# Patient Record
Sex: Female | Born: 1975 | Hispanic: No | Marital: Married | Smoking: Never smoker
Health system: Southern US, Community
[De-identification: ages and names within clinical notes are randomized; demographics above are authoritative.]

## PROBLEM LIST (undated history)

## (undated) DIAGNOSIS — Z789 Other specified health status: Secondary | ICD-10-CM

## (undated) HISTORY — PX: NO PAST SURGERIES: SHX2092

---

## 2014-10-18 LAB — OB RESULTS CONSOLE ANTIBODY SCREEN: Antibody Screen: NEGATIVE

## 2014-10-18 LAB — OB RESULTS CONSOLE VARICELLA ZOSTER ANTIBODY, IGG: Varicella: IMMUNE

## 2014-10-18 LAB — OB RESULTS CONSOLE GC/CHLAMYDIA
CHLAMYDIA, DNA PROBE: NEGATIVE
Gonorrhea: NEGATIVE

## 2014-10-18 LAB — OB RESULTS CONSOLE RUBELLA ANTIBODY, IGM: Rubella: IMMUNE

## 2014-10-18 LAB — OB RESULTS CONSOLE ABO/RH: RH Type: POSITIVE

## 2014-10-18 LAB — OB RESULTS CONSOLE RPR: RPR: NONREACTIVE

## 2014-10-18 LAB — OB RESULTS CONSOLE HGB/HCT, BLOOD
HCT: 36 %
HEMOGLOBIN: 12.5 g/dL

## 2014-10-18 LAB — OB RESULTS CONSOLE HIV ANTIBODY (ROUTINE TESTING): HIV: NONREACTIVE

## 2014-10-18 LAB — SICKLE CELL SCREEN: Sickle Cell Screen: NORMAL

## 2014-10-18 LAB — OB RESULTS CONSOLE HEPATITIS B SURFACE ANTIGEN: HEP B S AG: NEGATIVE

## 2014-11-12 ENCOUNTER — Other Ambulatory Visit (HOSPITAL_COMMUNITY): Payer: Self-pay | Admitting: Obstetrics and Gynecology

## 2014-11-12 ENCOUNTER — Other Ambulatory Visit: Payer: Self-pay | Admitting: Obstetrics and Gynecology

## 2014-11-12 DIAGNOSIS — O09522 Supervision of elderly multigravida, second trimester: Secondary | ICD-10-CM

## 2014-11-12 LAB — GLUCOSE TOLERANCE, 1 HOUR (50G) W/O FASTING: GLUCOSE 1 HOUR GTT: 126

## 2014-12-04 ENCOUNTER — Encounter (HOSPITAL_COMMUNITY): Payer: Self-pay

## 2014-12-04 ENCOUNTER — Other Ambulatory Visit (HOSPITAL_COMMUNITY): Payer: Self-pay | Admitting: Obstetrics and Gynecology

## 2014-12-04 ENCOUNTER — Ambulatory Visit (HOSPITAL_COMMUNITY)
Admission: RE | Admit: 2014-12-04 | Discharge: 2014-12-04 | Disposition: A | Discharge status: Home / Self Care | Payer: Medicaid Other | Source: Ambulatory Visit | Attending: Obstetrics and Gynecology | Admitting: Obstetrics and Gynecology

## 2014-12-04 DIAGNOSIS — Z3689 Encounter for other specified antenatal screening: Secondary | ICD-10-CM | POA: Insufficient documentation

## 2014-12-04 DIAGNOSIS — Z3A18 18 weeks gestation of pregnancy: Secondary | ICD-10-CM | POA: Diagnosis not present

## 2014-12-04 DIAGNOSIS — O09529 Supervision of elderly multigravida, unspecified trimester: Secondary | ICD-10-CM | POA: Insufficient documentation

## 2014-12-04 DIAGNOSIS — Z843 Family history of consanguinity: Secondary | ICD-10-CM

## 2014-12-04 DIAGNOSIS — O09522 Supervision of elderly multigravida, second trimester: Secondary | ICD-10-CM | POA: Diagnosis not present

## 2014-12-04 DIAGNOSIS — Z315 Encounter for genetic counseling: Secondary | ICD-10-CM | POA: Insufficient documentation

## 2014-12-04 HISTORY — DX: Other specified health status: Z78.9

## 2014-12-04 NOTE — Progress Notes (Signed)
Genetic Counseling  High-Risk Gestation Note  Appointment Date:  12/04/2014 Referred By: Caroll Rancher, MD Date of Birth:  12/17/1975   Pregnancy History: G3P0020 Estimated Date of Delivery: 05/03/15 Estimated Gestational Age: [redacted]w[redacted]d Attending: Renella Cunas, MD   Virginia Salas was seen for genetic counseling because of a maternal age of 39 y.o..  A relative assisted with Urdu/English interpretation for today's visit. Virginia Salas declined the use of medical interpreter.   She was counseled regarding maternal age and the association with risk for chromosome conditions due to nondisjunction with aging of the ova.   We reviewed chromosomes, nondisjunction, and the associated 1 in 65 risk for fetal aneuploidy related to a maternal age of 39 y.o. at [redacted]w[redacted]d gestation.  She was counseled that the risk for aneuploidy decreases as gestational age increases, accounting for those pregnancies which spontaneously abort.  We specifically discussed Down syndrome (trisomy 29), trisomies 4 and 39, and sex chromosome aneuploidies (47,XXX and 47,XXY) including the common features and prognoses of each.   Virginia Salas previously had Quad screening through [facility], facilitated through her OB office. We reviewed that these results were within normal range for the conditions screened. The risk for fetal Down syndrome was reduced from 1 in 135 to 1 in 584. The risk for trisomy 18 was increased to 1 in 201, but we discussed that this is still considered a screen negative result. Additionally, the risk for ONTDs was reduced. She/They were counseled that screening tests are used to modify a patient's a priori risk for aneuploidy, typically based on age. This estimate provides a pregnancy specific risk assessment. Quad screening is not diagnostic for these conditions.   We reviewed additional available screening options including noninvasive prenatal screening (NIPS)/cell free DNA (cfDNA)  testing and detailed ultrasound.  She was counseled that screening tests are used to modify a patient's a priori risk for aneuploidy, typically based on age. This estimate provides a pregnancy specific risk assessment. We reviewed the benefits and limitations of each option. Specifically, we discussed the conditions for which each test screens, the detection rates, and false positive rates of each. She was also counseled regarding diagnostic testing via amniocentesis. We reviewed the approximate 1 in 989-211 risk for complications for amniocentesis, including spontaneous pregnancy loss. After consideration of all the options, she declined NIPS and amniocentesis.   Detailed ultrasound was performed at the time of today's visit. Complete ultrasound results reported separately.  There were no visualized fetal anomalies or markers suggestive of aneuploidy. She understands that screening tests cannot rule out all birth defects or genetic syndromes. The patient was advised of this limitation and states she still does not want additional testing at this time.   Virginia Salas was provided with written information regarding cystic fibrosis (CF) including the carrier frequency and incidence in the Caucasian and Asian populations, the availability of carrier testing and prenatal diagnosis if indicated.  In addition, we discussed that CF is routinely screened for as part of the [place] newborn screening panel.  She declined testing today.   Both family histories were reviewed and found to be contributory for two previous miscarriages for the patient and her partner. Additionally, the patient and the father of the pregnancy are maternal first cousins to each other; their mothers are sisters. We discussed that children born to a consanguineous couple are at increased risk for genetic health problems.  This increase in risk is related to the possibility of passing on  recessive genes. We explained that every person carries  approximately 7-10 non-working genes that when received in a double dose results in recessive genetic conditions.  In general, unrelated couples have a relatively low risk of having a child with a recessive condition because the likelihood of both parents carrying the same non-working recessive gene is very low.  However, when a couple is related, they have inherited some of their genetic information from the same family member, which leads to an increased chance that they may carry the same recessive gene and have a child with a recessive condition.  For first cousin unions, the risk to have a child with a birth defect, mental retardation, or genetic condition is increased approximately 2-4% above the general population risk (3-5%).  We reviewed chromosomes, genes, and recessive inheritance in detail. Virginia Salas indicated that she was not interested in additional optional screening for potential fetal health conditions in the pregnancy. Without further information regarding the provided family history, an accurate genetic risk cannot be calculated. Further genetic counseling is warranted if more information is obtained.  Virginia Salas denied exposure to environmental toxins or chemical agents. She denied the use of alcohol, tobacco or street drugs. She denied significant viral illnesses during the course of her pregnancy. Her medical and surgical histories were noncontributory.   I counseled Virginia Salas regarding the above risks and available options.  The approximate face-to-face time with the genetic counselor was 40 minutes. Most of the counseling was provided by Earley Abide, [employer] genetic counseling student, under my direct supervision.   Chipper Oman, MS,  Certified Genetic Counselor 12/04/2014

## 2015-01-09 ENCOUNTER — Other Ambulatory Visit (HOSPITAL_COMMUNITY): Payer: Self-pay | Admitting: Maternal and Fetal Medicine

## 2015-01-09 DIAGNOSIS — O09523 Supervision of elderly multigravida, third trimester: Secondary | ICD-10-CM

## 2015-02-13 ENCOUNTER — Ambulatory Visit (HOSPITAL_COMMUNITY)
Admission: RE | Admit: 2015-02-13 | Discharge: 2015-02-13 | Disposition: A | Discharge status: Home / Self Care | Payer: Medicaid Other | Source: Ambulatory Visit | Attending: Obstetrics and Gynecology | Admitting: Obstetrics and Gynecology

## 2015-02-13 ENCOUNTER — Encounter (HOSPITAL_COMMUNITY): Payer: Self-pay

## 2015-02-13 DIAGNOSIS — D259 Leiomyoma of uterus, unspecified: Secondary | ICD-10-CM | POA: Diagnosis not present

## 2015-02-13 DIAGNOSIS — Z3A28 28 weeks gestation of pregnancy: Secondary | ICD-10-CM | POA: Insufficient documentation

## 2015-02-13 DIAGNOSIS — O09523 Supervision of elderly multigravida, third trimester: Secondary | ICD-10-CM | POA: Diagnosis not present

## 2015-02-13 DIAGNOSIS — O3413 Maternal care for benign tumor of corpus uteri, third trimester: Secondary | ICD-10-CM | POA: Insufficient documentation

## 2015-02-14 DIAGNOSIS — O3413 Maternal care for benign tumor of corpus uteri, third trimester: Secondary | ICD-10-CM | POA: Insufficient documentation

## 2015-02-14 DIAGNOSIS — D259 Leiomyoma of uterus, unspecified: Secondary | ICD-10-CM | POA: Insufficient documentation

## 2015-02-14 DIAGNOSIS — Z3A28 28 weeks gestation of pregnancy: Secondary | ICD-10-CM | POA: Insufficient documentation

## 2015-02-18 LAB — OB RESULTS CONSOLE RPR: RPR: NONREACTIVE

## 2015-02-18 LAB — GLUCOSE TOLERANCE, 1 HOUR (50G) W/O FASTING: GLUCOSE 1 HOUR GTT: 105 mg/dL (ref ?–200)

## 2015-02-18 LAB — OB RESULTS CONSOLE HGB/HCT, BLOOD
HEMATOCRIT: 37 %
HEMOGLOBIN: 12 g/dL

## 2015-02-18 LAB — OB RESULTS CONSOLE HIV ANTIBODY (ROUTINE TESTING): HIV: NONREACTIVE

## 2015-02-20 ENCOUNTER — Encounter: Payer: Self-pay | Admitting: *Deleted

## 2015-02-25 ENCOUNTER — Ambulatory Visit (INDEPENDENT_AMBULATORY_CARE_PROVIDER_SITE_OTHER): Payer: Medicaid Other | Admitting: Obstetrics & Gynecology

## 2015-02-25 ENCOUNTER — Encounter: Payer: Self-pay | Admitting: Obstetrics & Gynecology

## 2015-02-25 VITALS — BP 134/64 | HR 86 | Temp 97.9°F | Wt 149.8 lb

## 2015-02-25 DIAGNOSIS — O09523 Supervision of elderly multigravida, third trimester: Secondary | ICD-10-CM | POA: Diagnosis not present

## 2015-02-25 DIAGNOSIS — D259 Leiomyoma of uterus, unspecified: Secondary | ICD-10-CM

## 2015-02-25 DIAGNOSIS — O3413 Maternal care for benign tumor of corpus uteri, third trimester: Secondary | ICD-10-CM

## 2015-02-25 LAB — POCT URINALYSIS DIP (DEVICE)
Bilirubin Urine: NEGATIVE
GLUCOSE, UA: 500 mg/dL — AB
Hgb urine dipstick: NEGATIVE
Ketones, ur: NEGATIVE mg/dL
Leukocytes, UA: NEGATIVE
NITRITE: NEGATIVE
PROTEIN: NEGATIVE mg/dL
Specific Gravity, Urine: <=1.005 (ref 1.005–1.030)
Urobilinogen, UA: 0.2 mg/dL (ref 0.0–1.0)
pH: 5 (ref 5.0–8.0)

## 2015-02-25 NOTE — Progress Notes (Signed)
Transfer from [facility].  Patient unaware of pregravid weight.  Reports having 1hrgtt and tdap at [facility] last week-- will call to ensure.

## 2015-02-25 NOTE — Patient Instructions (Signed)
Third Trimester of Pregnancy The third trimester is from week 29 through week 42, months 7 through 9. The third trimester is a time when the fetus is growing rapidly. At the end of the ninth month, the fetus is about 20 inches in length and weighs 6-10 pounds.  BODY CHANGES Your body goes through many changes during pregnancy. The changes vary from woman to woman.   Your weight will continue to increase. You can expect to gain 25-35 pounds (11-16 kg) by the end of the pregnancy.  You may begin to get stretch marks on your hips, abdomen, and breasts.  You may urinate more often because the fetus is moving lower into your pelvis and pressing on your bladder.  You may develop or continue to have heartburn as a result of your pregnancy.  You may develop constipation because certain hormones are causing the muscles that push waste through your intestines to slow down.  You may develop hemorrhoids or swollen, bulging veins (varicose veins).  You may have pelvic pain because of the weight gain and pregnancy hormones relaxing your joints between the bones in your pelvis. Backaches may result from overexertion of the muscles supporting your posture.  You may have changes in your hair. These can include thickening of your hair, rapid growth, and changes in texture. Some women also have hair loss during or after pregnancy, or hair that feels dry or thin. Your hair will most likely return to normal after your baby is born.  Your breasts will continue to grow and be tender. A yellow discharge may leak from your breasts called colostrum.  Your belly button may stick out.  You may feel short of breath because of your expanding uterus.  You may notice the fetus "dropping," or moving lower in your abdomen.  You may have a bloody mucus discharge. This usually occurs a few days to a week before labor begins.  Your cervix becomes thin and soft (effaced) near your due date. WHAT TO EXPECT AT YOUR PRENATAL  EXAMS  You will have prenatal exams every 2 weeks until week 36. Then, you will have weekly prenatal exams. During a routine prenatal visit:  You will be weighed to make sure you and the fetus are growing normally.  Your blood pressure is taken.  Your abdomen will be measured to track your baby's growth.  The fetal heartbeat will be listened to.  Any test results from the previous visit will be discussed.  You may have a cervical check near your due date to see if you have effaced. At around 36 weeks, your caregiver will check your cervix. At the same time, your caregiver will also perform a test on the secretions of the vaginal tissue. This test is to determine if a type of bacteria, Group B streptococcus, is present. Your caregiver will explain this further. Your caregiver may ask you:  What your birth plan is.  How you are feeling.  If you are feeling the baby move.  If you have had any abnormal symptoms, such as leaking fluid, bleeding, severe headaches, or abdominal cramping.  If you have any questions. Other tests or screenings that may be performed during your third trimester include:  Blood tests that check for low iron levels (anemia).  Fetal testing to check the health, activity level, and growth of the fetus. Testing is done if you have certain medical conditions or if there are problems during the pregnancy. FALSE LABOR You may feel small, irregular contractions that   eventually go away. These are called Braxton Hicks contractions, or false labor. Contractions may last for hours, days, or even weeks before true labor sets in. If contractions come at regular intervals, intensify, or become painful, it is best to be seen by your caregiver.  SIGNS OF LABOR   Menstrual-like cramps.  Contractions that are 5 minutes apart or less.  Contractions that start on the top of the uterus and spread down to the lower abdomen and back.  A sense of increased pelvic pressure or back  pain.  A watery or bloody mucus discharge that comes from the vagina. If you have any of these signs before the 37th week of pregnancy, call your caregiver right away. You need to go to the hospital to get checked immediately. HOME CARE INSTRUCTIONS   Avoid all smoking, herbs, alcohol, and unprescribed drugs. These chemicals affect the formation and growth of the baby.  Follow your caregiver's instructions regarding medicine use. There are medicines that are either safe or unsafe to take during pregnancy.  Exercise only as directed by your caregiver. Experiencing uterine cramps is a good sign to stop exercising.  Continue to eat regular, healthy meals.  Wear a good support bra for breast tenderness.  Do not use hot tubs, steam rooms, or saunas.  Wear your seat belt at all times when driving.  Avoid raw meat, uncooked cheese, cat litter boxes, and soil used by cats. These carry germs that can cause birth defects in the baby.  Take your prenatal vitamins.  Try taking a stool softener (if your caregiver approves) if you develop constipation. Eat more high-fiber foods, such as fresh vegetables or fruit and whole grains. Drink plenty of fluids to keep your urine clear or pale yellow.  Take warm sitz baths to soothe any pain or discomfort caused by hemorrhoids. Use hemorrhoid cream if your caregiver approves.  If you develop varicose veins, wear support hose. Elevate your feet for 15 minutes, 3-4 times a day. Limit salt in your diet.  Avoid heavy lifting, wear low heal shoes, and practice good posture.  Rest a lot with your legs elevated if you have leg cramps or low back pain.  Visit your dentist if you have not gone during your pregnancy. Use a soft toothbrush to brush your teeth and be gentle when you floss.  A sexual relationship may be continued unless your caregiver directs you otherwise.  Do not travel far distances unless it is absolutely necessary and only with the approval  of your caregiver.  Take prenatal classes to understand, practice, and ask questions about the labor and delivery.  Make a trial run to the hospital.  Pack your hospital bag.  Prepare the baby's nursery.  Continue to go to all your prenatal visits as directed by your caregiver. SEEK MEDICAL CARE IF:  You are unsure if you are in labor or if your water has broken.  You have dizziness.  You have mild pelvic cramps, pelvic pressure, or nagging pain in your abdominal area.  You have persistent nausea, vomiting, or diarrhea.  You have a bad smelling vaginal discharge.  You have pain with urination. SEEK IMMEDIATE MEDICAL CARE IF:   You have a fever.  You are leaking fluid from your vagina.  You have spotting or bleeding from your vagina.  You have severe abdominal cramping or pain.  You have rapid weight loss or gain.  You have shortness of breath with chest pain.  You notice sudden or extreme swelling   of your face, hands, ankles, feet, or legs.  You have not felt your baby move in over an hour.  You have severe headaches that do not go away with medicine.  You have vision changes. Document Released: 08/18/2001 Document Revised: 08/29/2013 Document Reviewed: 10/25/2012 ExitCare Patient Information 2015 [employer]. This information is not intended to replace advice given to you by your health care provider. Make sure you discuss any questions you have with your health care provider.  

## 2015-02-25 NOTE — Progress Notes (Signed)
Subjective:transfef from HD fibroids  Virginia Salas is a 39 y.o. G3P0020 at [redacted]w[redacted]d being seen today for new ob prenatal care. Fibroid uterus, [place] reviewed Patient reports no complaints.  Contractions: Not present.  Vag. Bleeding: None. Movement: Present. Denies leaking of fluid.   The following portions of the patient's history were reviewed and updated as appropriate: allergies, current medications, past family history, past medical history, past social history, past surgical history and problem list.   Objective:   Filed Vitals:   02/25/15 0756  BP: 134/64  Pulse: 86  Temp: 97.9 F (36.6 C)  Weight: 149 lb 12.8 oz (67.949 kg)    Fetal Status:     Movement: Present     General:  Alert, oriented and cooperative. Patient is in no acute distress.  Skin: Skin is warm and dry. No rash noted.   Cardiovascular: Normal heart rate noted  Respiratory: Effort and breath sounds normal, no problems with respiration noted  Abdomen: Soft, gravid, appropriate for gestational age. Pain/Pressure: Absent     Vaginal: Vag. Bleeding: None.       Cervix: Not evaluated       Extremities: Normal range of motion.  Edema: Trace  Mental Status: Normal mood and affect. Normal behavior. Normal judgment and thought content.   Urinalysis: Urine Protein: Negative Urine Glucose: 3+  Assessment and Plan:  Pregnancy: G3P0020 at [redacted]w[redacted]d  1. AMA (advanced maternal age) multigravida 25+, third trimester Fibroid uterus   Preterm labor symptoms and general obstetric precautions including but not limited to vaginal bleeding, contractions, leaking of fluid and fetal movement were reviewed in detail with the patient.  Please refer to After Visit Summary for other counseling recommendations.   Return in about 2 weeks (around 03/11/2015).   Woodroe Mode, MD  02/25/2015

## 2015-03-13 ENCOUNTER — Encounter: Payer: Medicaid Other | Admitting: Advanced Practice Midwife

## 2015-03-15 ENCOUNTER — Ambulatory Visit (INDEPENDENT_AMBULATORY_CARE_PROVIDER_SITE_OTHER): Payer: Medicaid Other | Admitting: Family

## 2015-03-15 VITALS — BP 126/71 | HR 83 | Temp 97.7°F | Wt 154.1 lb

## 2015-03-15 DIAGNOSIS — O0993 Supervision of high risk pregnancy, unspecified, third trimester: Secondary | ICD-10-CM | POA: Diagnosis not present

## 2015-03-15 DIAGNOSIS — O09523 Supervision of elderly multigravida, third trimester: Secondary | ICD-10-CM | POA: Diagnosis not present

## 2015-03-15 DIAGNOSIS — O0990 Supervision of high risk pregnancy, unspecified, unspecified trimester: Secondary | ICD-10-CM | POA: Insufficient documentation

## 2015-03-15 LAB — POCT URINALYSIS DIP (DEVICE)
Bilirubin Urine: NEGATIVE
GLUCOSE, UA: NEGATIVE mg/dL
Hgb urine dipstick: NEGATIVE
Ketones, ur: NEGATIVE mg/dL
LEUKOCYTES UA: NEGATIVE
Nitrite: NEGATIVE
PROTEIN: NEGATIVE mg/dL
Urobilinogen, UA: 0.2 mg/dL (ref 0.0–1.0)
pH: 6 (ref 5.0–8.0)

## 2015-03-15 NOTE — Progress Notes (Signed)
Reviewed tip of week with patient  Virginia Salas used for interpreter

## 2015-03-15 NOTE — Progress Notes (Signed)
Subjective:  Virginia Salas is a 39 y.o. G3P0020 at [redacted]w[redacted]d being seen today for ongoing prenatal care.  Patient reports occasional irritability.  Contractions: Irritability.  Vag. Bleeding: None. Movement: Present. Denies leaking of fluid.   The following portions of the patient's history were reviewed and updated as appropriate: allergies, current medications, past family history, past medical history, past social history, past surgical history and problem list.   Objective:   Filed Vitals:   03/15/15 1042  BP: 126/71  Pulse: 83  Temp: 97.7 F (36.5 C)  Weight: 154 lb 1.6 oz (69.899 kg)    Fetal Status: Fetal Heart Rate (bpm): 134   Movement: Present     General:  Alert, oriented and cooperative. Patient is in no acute distress.  Skin: Skin is warm and dry. No rash noted.   Cardiovascular: Normal heart rate noted  Respiratory: Normal respiratory effort, no problems with respiration noted  Abdomen: Soft, gravid, appropriate for gestational age. Pain/Pressure: Present     Vaginal: Vag. Bleeding: None.    Vag D/C Character: White  Cervix: Not evaluated        Extremities: Normal range of motion.  Edema: Trace  Mental Status: Normal mood and affect. Normal behavior. Normal judgment and thought content.   Urinalysis: Urine Protein: Negative Urine Glucose: Negative  Assessment and Plan:  Pregnancy: G3P0020 at [redacted]w[redacted]d  1. Advanced maternal age in multigravida, third trimester   Preterm labor symptoms and general obstetric precautions including but not limited to vaginal bleeding, contractions, leaking of fluid and fetal movement were reviewed in detail with the patient.  Please refer to After Visit Summary for other counseling recommendations.   Return in about 2 weeks (around 03/29/2015).   Venia Carbon Michiel Cowboy, CNM

## 2015-03-19 ENCOUNTER — Encounter: Payer: Self-pay | Admitting: *Deleted

## 2015-03-19 NOTE — Progress Notes (Signed)
FMLA completed and placed up front for pick up for patient at next visit.

## 2015-04-01 ENCOUNTER — Ambulatory Visit (INDEPENDENT_AMBULATORY_CARE_PROVIDER_SITE_OTHER): Payer: BLUE CROSS/BLUE SHIELD | Admitting: Family Medicine

## 2015-04-01 VITALS — BP 129/74 | HR 89 | Temp 98.1°F | Wt 157.2 lb

## 2015-04-01 DIAGNOSIS — D259 Leiomyoma of uterus, unspecified: Secondary | ICD-10-CM

## 2015-04-01 DIAGNOSIS — O0993 Supervision of high risk pregnancy, unspecified, third trimester: Secondary | ICD-10-CM

## 2015-04-01 DIAGNOSIS — Z843 Family history of consanguinity: Secondary | ICD-10-CM

## 2015-04-01 DIAGNOSIS — O09523 Supervision of elderly multigravida, third trimester: Secondary | ICD-10-CM | POA: Diagnosis not present

## 2015-04-01 DIAGNOSIS — O3413 Maternal care for benign tumor of corpus uteri, third trimester: Secondary | ICD-10-CM

## 2015-04-01 LAB — POCT URINALYSIS DIP (DEVICE)
BILIRUBIN URINE: NEGATIVE
Glucose, UA: 100 mg/dL — AB
HGB URINE DIPSTICK: NEGATIVE
KETONES UR: NEGATIVE mg/dL
LEUKOCYTES UA: NEGATIVE
NITRITE: NEGATIVE
PROTEIN: NEGATIVE mg/dL
SPECIFIC GRAVITY, URINE: 1.01 (ref 1.005–1.030)
Urobilinogen, UA: 0.2 mg/dL (ref 0.0–1.0)
pH: 6 (ref 5.0–8.0)

## 2015-04-01 NOTE — Progress Notes (Signed)
**Note Virginia-Identified via Obfuscation** Subjective:  Virginia Salas is a 39 y.o. G3P0020 at [redacted]w[redacted]d being seen today for ongoing prenatal care.  Patient reports backache- upper back and neck.  Contractions: Irritability.  Vag. Bleeding: None. Movement: Present. Denies leaking of fluid.   The following portions of the patient's history were reviewed and updated as appropriate: allergies, current medications, past family history, past medical history, past social history, past surgical history and problem list.   Objective:   Filed Vitals:   04/01/15 1605  BP: 129/74  Pulse: 89  Temp: 98.1 F (36.7 C)  Weight: 157 lb 3.2 oz (71.305 kg)    Fetal Status: Fetal Heart Rate (bpm): 141   Movement: Present     General:  Alert, oriented and cooperative. Patient is in no acute distress.  Skin: Skin is warm and dry. No rash noted.   Cardiovascular: Normal heart rate noted  Respiratory: Normal respiratory effort, no problems with respiration noted  Abdomen: Soft, gravid, appropriate for gestational age. Pain/Pressure: Present     Vaginal: Vag. Bleeding: None.    Vag D/C Character: White  Cervix: Not evaluated        Extremities: Normal range of motion.  Edema: Trace  Mental Status: Normal mood and affect. Normal behavior. Normal judgment and thought content.   Urinalysis:      Assessment and Plan:  Pregnancy: G3P0020 at [redacted]w[redacted]d  1. Uterine fibroids affecting pregnancy in third trimester, antepartum  2. AMA (advanced maternal age) multigravida 67+, third trimester -begin antenatal monitoring in 4 days with twice weekly NSTs - no indication for IOL prior to 41 weeks currently  3. Consanguinity -Quad screening negative, declined NIPS and amnio  4. Supervision of high risk pregnancy, antepartum, third trimester NST starting in 4days- see above.  Discussed labor coping- does not want epidural currently due to friend with bad outcome (numb legs and inability to sit after epidural) Reviewed labor signs in detail  5. Trapezius muscle  tension: recommended stretches, heat and tylenol prn.   Preterm labor symptoms and general obstetric precautions including but not limited to vaginal bleeding, contractions, leaking of fluid and fetal movement were reviewed in detail with the patient. Please refer to After Visit Summary for other counseling recommendations.  Return in about 4 days (around 04/05/2015).   Caren Macadam, MD

## 2015-04-01 NOTE — Progress Notes (Signed)
Breastfeeding tip of the week reviewed Interpreter present for encounter

## 2015-04-01 NOTE — Patient Instructions (Signed)
Third Trimester of Pregnancy The third trimester is from week 29 through week 42, months 7 through 9. The third trimester is a time when the fetus is growing rapidly. At the end of the ninth month, the fetus is about 20 inches in length and weighs 6-10 pounds.  BODY CHANGES Your body goes through many changes during pregnancy. The changes vary from woman to woman.   Your weight will continue to increase. You can expect to gain 25-35 pounds (11-16 kg) by the end of the pregnancy.  You may begin to get stretch marks on your hips, abdomen, and breasts.  You may urinate more often because the fetus is moving lower into your pelvis and pressing on your bladder.  You may develop or continue to have heartburn as a result of your pregnancy.  You may develop constipation because certain hormones are causing the muscles that push waste through your intestines to slow down.  You may develop hemorrhoids or swollen, bulging veins (varicose veins).  You may have pelvic pain because of the weight gain and pregnancy hormones relaxing your joints between the bones in your pelvis. Backaches may result from overexertion of the muscles supporting your posture.  You may have changes in your hair. These can include thickening of your hair, rapid growth, and changes in texture. Some women also have hair loss during or after pregnancy, or hair that feels dry or thin. Your hair will most likely return to normal after your baby is born.  Your breasts will continue to grow and be tender. A yellow discharge may leak from your breasts called colostrum.  Your belly button may stick out.  You may feel short of breath because of your expanding uterus.  You may notice the fetus "dropping," or moving lower in your abdomen.  You may have a bloody mucus discharge. This usually occurs a few days to a week before labor begins.  Your cervix becomes thin and soft (effaced) near your due date. WHAT TO EXPECT AT YOUR PRENATAL  EXAMS  You will have prenatal exams every 2 weeks until week 36. Then, you will have weekly prenatal exams. During a routine prenatal visit:  You will be weighed to make sure you and the fetus are growing normally.  Your blood pressure is taken.  Your abdomen will be measured to track your baby's growth.  The fetal heartbeat will be listened to.  Any test results from the previous visit will be discussed.  You may have a cervical check near your due date to see if you have effaced. At around 36 weeks, your caregiver will check your cervix. At the same time, your caregiver will also perform a test on the secretions of the vaginal tissue. This test is to determine if a type of bacteria, Group B streptococcus, is present. Your caregiver will explain this further. Your caregiver may ask you:  What your birth plan is.  How you are feeling.  If you are feeling the baby move.  If you have had any abnormal symptoms, such as leaking fluid, bleeding, severe headaches, or abdominal cramping.  If you have any questions. Other tests or screenings that may be performed during your third trimester include:  Blood tests that check for low iron levels (anemia).  Fetal testing to check the health, activity level, and growth of the fetus. Testing is done if you have certain medical conditions or if there are problems during the pregnancy. FALSE LABOR You may feel small, irregular contractions that   eventually go away. These are called Braxton Hicks contractions, or false labor. Contractions may last for hours, days, or even weeks before true labor sets in. If contractions come at regular intervals, intensify, or become painful, it is best to be seen by your caregiver.  SIGNS OF LABOR   Menstrual-like cramps.  Contractions that are 5 minutes apart or less.  Contractions that start on the top of the uterus and spread down to the lower abdomen and back.  A sense of increased pelvic pressure or back  pain.  A watery or bloody mucus discharge that comes from the vagina. If you have any of these signs before the 37th week of pregnancy, call your caregiver right away. You need to go to the hospital to get checked immediately. HOME CARE INSTRUCTIONS   Avoid all smoking, herbs, alcohol, and unprescribed drugs. These chemicals affect the formation and growth of the baby.  Follow your caregiver's instructions regarding medicine use. There are medicines that are either safe or unsafe to take during pregnancy.  Exercise only as directed by your caregiver. Experiencing uterine cramps is a good sign to stop exercising.  Continue to eat regular, healthy meals.  Wear a good support bra for breast tenderness.  Do not use hot tubs, steam rooms, or saunas.  Wear your seat belt at all times when driving.  Avoid raw meat, uncooked cheese, cat litter boxes, and soil used by cats. These carry germs that can cause birth defects in the baby.  Take your prenatal vitamins.  Try taking a stool softener (if your caregiver approves) if you develop constipation. Eat more high-fiber foods, such as fresh vegetables or fruit and whole grains. Drink plenty of fluids to keep your urine clear or pale yellow.  Take warm sitz baths to soothe any pain or discomfort caused by hemorrhoids. Use hemorrhoid cream if your caregiver approves.  If you develop varicose veins, wear support hose. Elevate your feet for 15 minutes, 3-4 times a day. Limit salt in your diet.  Avoid heavy lifting, wear low heal shoes, and practice good posture.  Rest a lot with your legs elevated if you have leg cramps or low back pain.  Visit your dentist if you have not gone during your pregnancy. Use a soft toothbrush to brush your teeth and be gentle when you floss.  A sexual relationship may be continued unless your caregiver directs you otherwise.  Do not travel far distances unless it is absolutely necessary and only with the approval  of your caregiver.  Take prenatal classes to understand, practice, and ask questions about the labor and delivery.  Make a trial run to the hospital.  Pack your hospital bag.  Prepare the baby's nursery.  Continue to go to all your prenatal visits as directed by your caregiver. SEEK MEDICAL CARE IF:  You are unsure if you are in labor or if your water has broken.  You have dizziness.  You have mild pelvic cramps, pelvic pressure, or nagging pain in your abdominal area.  You have persistent nausea, vomiting, or diarrhea.  You have a bad smelling vaginal discharge.  You have pain with urination. SEEK IMMEDIATE MEDICAL CARE IF:   You have a fever.  You are leaking fluid from your vagina.  You have spotting or bleeding from your vagina.  You have severe abdominal cramping or pain.  You have rapid weight loss or gain.  You have shortness of breath with chest pain.  You notice sudden or extreme swelling   of your face, hands, ankles, feet, or legs.  You have not felt your baby move in over an hour.  You have severe headaches that do not go away with medicine.  You have vision changes. Document Released: 08/18/2001 Document Revised: 08/29/2013 Document Reviewed: 10/25/2012 ExitCare Patient Information 2015 [employer]. This information is not intended to replace advice given to you by your health care provider. Make sure you discuss any questions you have with your health care provider.  

## 2015-04-04 ENCOUNTER — Ambulatory Visit (INDEPENDENT_AMBULATORY_CARE_PROVIDER_SITE_OTHER): Payer: BLUE CROSS/BLUE SHIELD | Admitting: *Deleted

## 2015-04-04 VITALS — BP 127/76 | HR 85

## 2015-04-04 DIAGNOSIS — O09523 Supervision of elderly multigravida, third trimester: Secondary | ICD-10-CM | POA: Diagnosis not present

## 2015-04-05 NOTE — Progress Notes (Signed)
Interpreter present for encounter.  Pt was scheduled today for NST due to [facility] however fetal testing guideline states testing is not indicated for age less than 28.  NST completed for today and pt was given explanation as to why further fetal testing is not currently indicated. Pt advised to return to hospital if decreased FM occurs. She voiced understanding of all information and instructions given.

## 2015-04-09 NOTE — Progress Notes (Signed)
NST reviewed and reactive.  Derrin Currey L. Harraway-Smith, M.D., FACOG    

## 2015-04-11 ENCOUNTER — Ambulatory Visit (INDEPENDENT_AMBULATORY_CARE_PROVIDER_SITE_OTHER): Payer: BLUE CROSS/BLUE SHIELD | Admitting: Family

## 2015-04-11 ENCOUNTER — Other Ambulatory Visit: Payer: Self-pay | Admitting: Family

## 2015-04-11 VITALS — BP 132/67 | HR 91 | Temp 97.8°F | Wt 158.3 lb

## 2015-04-11 DIAGNOSIS — O0993 Supervision of high risk pregnancy, unspecified, third trimester: Secondary | ICD-10-CM

## 2015-04-11 LAB — POCT URINALYSIS DIP (DEVICE)
Bilirubin Urine: NEGATIVE
Glucose, UA: 100 mg/dL — AB
Hgb urine dipstick: NEGATIVE
KETONES UR: NEGATIVE mg/dL
Leukocytes, UA: NEGATIVE
Nitrite: NEGATIVE
PROTEIN: NEGATIVE mg/dL
Specific Gravity, Urine: 1.015 (ref 1.005–1.030)
Urobilinogen, UA: 0.2 mg/dL (ref 0.0–1.0)
pH: 7 (ref 5.0–8.0)

## 2015-04-11 LAB — OB RESULTS CONSOLE GBS: GBS: NEGATIVE

## 2015-04-11 LAB — OB RESULTS CONSOLE GC/CHLAMYDIA
CHLAMYDIA, DNA PROBE: NEGATIVE
GC PROBE AMP, GENITAL: NEGATIVE

## 2015-04-11 NOTE — Progress Notes (Signed)
Christopher interpreter # [phone]; Riz  Edema- feet    Pressure- lower abd

## 2015-04-11 NOTE — Progress Notes (Signed)
Subjective:  Virginia Salas is a 39 y.o. G3P0020 at [redacted]w[redacted]d being seen today for ongoing prenatal care.  Patient reports lower pelvic pressure.  Contractions: Irritability.  Vag. Bleeding: None. Movement: Present. Denies leaking of fluid.   The following portions of the patient's history were reviewed and updated as appropriate: allergies, current medications, past family history, past medical history, past social history, past surgical history and problem list.   Objective:   Filed Vitals:   04/11/15 0816  BP: 132/67  Pulse: 91  Temp: 97.8 F (36.6 C)  Weight: 158 lb 4.8 oz (71.804 kg)    Fetal Status: Fetal Heart Rate (bpm): 137   Movement: Present     General:  Alert, oriented and cooperative. Patient is in no acute distress.  Skin: Skin is warm and dry. No rash noted.   Cardiovascular: Normal heart rate noted  Respiratory: Normal respiratory effort, no problems with respiration noted  Abdomen: Soft, gravid, appropriate for gestational age. Pain/Pressure: Present     Vaginal: Vag. Bleeding: None.    Vag D/C Character: White  Cervix: Exam revealed        Extremities: Normal range of motion.  Edema: Trace  Mental Status: Normal mood and affect. Normal behavior. Normal judgment and thought content.   Urinalysis: Urine Protein: Negative Urine Glucose: 1+  Assessment and Plan:  Pregnancy: G3P0020 at [redacted]w[redacted]d  1. Supervision of high risk pregnancy, antepartum, third trimester - Culture, beta strep (group b only) - GC/Chlamydia Probe Amp  Term labor symptoms and general obstetric precautions including but not limited to vaginal bleeding, contractions, leaking of fluid and fetal movement were reviewed in detail with the patient. Please refer to After Visit Summary for other counseling recommendations.   Return in about 1 week (around 04/18/2015).   Venia Carbon Michiel Cowboy, CNM

## 2015-04-12 LAB — GC/CHLAMYDIA PROBE AMP
CT Probe RNA: NEGATIVE
GC Probe RNA: NEGATIVE

## 2015-04-13 LAB — CULTURE, BETA STREP (GROUP B ONLY)

## 2015-04-18 ENCOUNTER — Ambulatory Visit (INDEPENDENT_AMBULATORY_CARE_PROVIDER_SITE_OTHER): Payer: BLUE CROSS/BLUE SHIELD | Admitting: Family Medicine

## 2015-04-18 ENCOUNTER — Encounter: Payer: BLUE CROSS/BLUE SHIELD | Admitting: Family Medicine

## 2015-04-18 VITALS — BP 126/79 | HR 88 | Temp 98.6°F | Wt 159.9 lb

## 2015-04-18 DIAGNOSIS — O0993 Supervision of high risk pregnancy, unspecified, third trimester: Secondary | ICD-10-CM

## 2015-04-18 DIAGNOSIS — O09523 Supervision of elderly multigravida, third trimester: Secondary | ICD-10-CM | POA: Diagnosis not present

## 2015-04-18 LAB — POCT URINALYSIS DIP (DEVICE)
Bilirubin Urine: NEGATIVE
Glucose, UA: 100 mg/dL — AB
Hgb urine dipstick: NEGATIVE
Ketones, ur: NEGATIVE mg/dL
LEUKOCYTES UA: NEGATIVE
NITRITE: NEGATIVE
Protein, ur: NEGATIVE mg/dL
Specific Gravity, Urine: <=1.005 (ref 1.005–1.030)
UROBILINOGEN UA: 0.2 mg/dL (ref 0.0–1.0)
pH: 5.5 (ref 5.0–8.0)

## 2015-04-18 MED ORDER — PRENATAL VITAMINS PLUS 27-1 MG PO TABS: 1.0000 | ORAL_TABLET | Freq: Every day | ORAL | Status: DC

## 2015-04-18 NOTE — Progress Notes (Signed)
Used interpreter  Reymundo Poll.

## 2015-04-18 NOTE — Progress Notes (Signed)
Subjective:  Virginia Salas is a 40 y.o. G3P0020 at [redacted]w[redacted]d being seen today for ongoing prenatal care.  Patient reports no complaints.  Contractions: Irregular.  Vag. Bleeding: None. Movement: Present. Denies leaking of fluid.   The following portions of the patient's history were reviewed and updated as appropriate: allergies, current medications, past family history, past medical history, past social history, past surgical history and problem list.   Objective:   Filed Vitals:   04/18/15 0807  BP: 126/79  Pulse: 88  Temp: 98.6 F (37 C)  Weight: 159 lb 14.4 oz (72.53 kg)    Fetal Status: Fetal Heart Rate (bpm): 128 Fundal Height: 38 cm Movement: Present  Presentation: Vertex  General:  Alert, oriented and cooperative. Patient is in no acute distress.  Skin: Skin is warm and dry. No rash noted.   Cardiovascular: Normal heart rate noted  Respiratory: Normal respiratory effort, no problems with respiration noted  Abdomen: Soft, gravid, appropriate for gestational age. Pain/Pressure: Present     Pelvic: Vag. Bleeding: None     Cervical exam deferred        Extremities: Normal range of motion.  Edema: Trace  Mental Status: Normal mood and affect. Normal behavior. Normal judgment and thought content.   Urinalysis:      Assessment and Plan:  Pregnancy: G3P0020 at [redacted]w[redacted]d  1. Supervision of high risk pregnancy, antepartum, third trimester Normal fundal height and FHT.  2. AMA (advanced maternal age) multigravida 51+, third trimester No other concerns. - Prenatal Vit-Fe Fumarate-FA (PRENATAL VITAMINS PLUS) 27-1 MG TABS; Take 1 tablet by mouth daily.  Dispense: 30 tablet; Refill: 3  Term labor symptoms and general obstetric precautions including but not limited to vaginal bleeding, contractions, leaking of fluid and fetal movement were reviewed in detail with the patient. Please refer to After Visit Summary for other counseling recommendations.  Return in about 1 week (around  04/25/2015).   Truett Mainland, DO

## 2015-04-18 NOTE — Patient Instructions (Signed)
Third Trimester of Pregnancy The third trimester is from week 29 through week 42, months 7 through 9. This trimester is when your unborn baby (fetus) is growing very fast. At the end of the ninth month, the unborn baby is about 20 inches in length. It weighs about 6-10 pounds.  HOME CARE   Avoid all smoking, herbs, and alcohol. Avoid drugs not approved by your doctor.  Only take medicine as told by your doctor. Some medicines are safe and some are not during pregnancy.  Exercise only as told by your doctor. Stop exercising if you start having cramps.  Eat regular, healthy meals.  Wear a good support bra if your breasts are tender.  Do not use hot tubs, steam rooms, or saunas.  Wear your seat belt when driving.  Avoid raw meat, uncooked cheese, and liter boxes and soil used by cats.  Take your prenatal vitamins.  Try taking medicine that helps you poop (stool softener) as needed, and if your doctor approves. Eat more fiber by eating fresh fruit, vegetables, and whole grains. Drink enough fluids to keep your pee (urine) clear or pale yellow.  Take warm water baths (sitz baths) to soothe pain or discomfort caused by hemorrhoids. Use hemorrhoid cream if your doctor approves.  If you have puffy, bulging veins (varicose veins), wear support hose. Raise (elevate) your feet for 15 minutes, 3-4 times a day. Limit salt in your diet.  Avoid heavy lifting, wear low heels, and sit up straight.  Rest with your legs raised if you have leg cramps or low back pain.  Visit your dentist if you have not gone during your pregnancy. Use a soft toothbrush to brush your teeth. Be gentle when you floss.  You can have sex (intercourse) unless your doctor tells you not to.  Do not travel far distances unless you must. Only do so with your doctor's approval.  Take prenatal classes.  Practice driving to the hospital.  Pack your hospital bag.  Prepare the baby's room.  Go to your doctor visits. GET  HELP IF:  You are not sure if you are in labor or if your water has broken.  You are dizzy.  You have mild cramps or pressure in your lower belly (abdominal).  You have a nagging pain in your belly area.  You continue to feel sick to your stomach (nauseous), throw up (vomit), or have watery poop (diarrhea).  You have bad smelling fluid coming from your vagina.  You have pain with peeing (urination). GET HELP RIGHT AWAY IF:   You have a fever.  You are leaking fluid from your vagina.  You are spotting or bleeding from your vagina.  You have severe belly cramping or pain.  You lose or gain weight rapidly.  You have trouble catching your breath and have chest pain.  You notice sudden or extreme puffiness (swelling) of your face, hands, ankles, feet, or legs.  You have not felt the baby move in over an hour.  You have severe headaches that do not go away with medicine.  You have vision changes. Document Released: 11/18/2009 Document Revised: 12/19/2012 Document Reviewed: 10/25/2012 [facility] Patient Information 2015 [place], [place]. This information is not intended to replace advice given to you by your health care provider. Make sure you discuss any questions you have with your health care provider.

## 2015-04-24 ENCOUNTER — Inpatient Hospital Stay (HOSPITAL_COMMUNITY)
Admission: AD | Admit: 2015-04-24 | Discharge: 2015-04-24 | Disposition: A | Discharge status: Home / Self Care | Payer: BLUE CROSS/BLUE SHIELD | Source: Ambulatory Visit | Attending: Family Medicine | Admitting: Family Medicine

## 2015-04-24 DIAGNOSIS — O368130 Decreased fetal movements, third trimester, not applicable or unspecified: Secondary | ICD-10-CM | POA: Diagnosis not present

## 2015-04-24 DIAGNOSIS — Z3A38 38 weeks gestation of pregnancy: Secondary | ICD-10-CM | POA: Insufficient documentation

## 2015-04-24 DIAGNOSIS — O368190 Decreased fetal movements, unspecified trimester, not applicable or unspecified: Secondary | ICD-10-CM

## 2015-04-24 NOTE — H&P (Signed)
MAU HISTORY AND PHYSICAL  Chief Complaint:  Decreased fetal movement  Virginia Salas is a 39 y.o.  G3P0020 with IUP at [redacted]w[redacted]d presenting for above. Yesterday cites decreased fetal movement, today normal but decreased in intensity. Patient states she has been having  irregular, mild contractions, none vaginal bleeding, intact membranes. No significant pain or fever, no sob or ha or visual changes. Has not done kick counts.  Menstrual History: OB History    Gravida Para Term Preterm AB TAB SAB Ectopic Multiple Living   3    2  2    0       Patient's last menstrual period was 07/27/2014.      Past Medical History  Diagnosis Date  . Medical history non-contributory     Past Surgical History  Procedure Laterality Date  . No past surgeries      No family history on file.  Social History  Substance Use Topics  . Smoking status: Never Smoker   . Smokeless tobacco: Never Used  . Alcohol Use: No     No Known Allergies  Prescriptions prior to admission  Medication Sig Dispense Refill Last Dose  . Prenatal Vit-Fe Fumarate-FA (PRENATAL MULTIVITAMIN) TABS tablet Take 1 tablet by mouth daily at 12 noon.   04/23/2015 at Unknown time    Review of Systems - Negative except for what is mentioned in HPI.  Physical Exam  Vitals: wnl GENERAL: Well-developed, well-nourished female in no acute distress.  LUNGS: Clear to auscultation bilaterally.  HEART: Regular rate and rhythm. ABDOMEN: Soft, nontender, nondistended, gravid.  EXTREMITIES: Nontender, no edema, 2+ distal pulses. Cervical Exam: not assessed Presentation: cephalic FHT:  Baseline rate 145, mod, +a, -d  Not contracting   Labs: No results found for this or any previous visit (from the past 24 hour(s)).  Imaging Studies:  AFI: 7.98  Assessment/Plan: Virginia Salas is  39 y.o. G3P0020 at [redacted]w[redacted]d presents with decreased fetal movement. Reactive NST, AFI wnl (7.98 cm), no signs labor or prom. Otherwise uncomplicated pregnancy;  vertex position. D/c home with interval growth scan, clinic f/u this week as planned, and kick count instructions.  Patsy Lager Dalphine Cowie 8/17/20169:42 AM

## 2015-04-24 NOTE — Discharge Instructions (Signed)
Seek medical attention for vaginal bleeding, a gush of fluid suggesting you have broken your water, or if you do not count the recommended number of baby movements according to your daily kick counts

## 2015-04-24 NOTE — MAU Note (Signed)
Pt reports decreased fetal movement since 11 yesterday morning. C/o occasion mild ctx as well. denies leaking or bleeding.

## 2015-05-05 ENCOUNTER — Encounter (HOSPITAL_COMMUNITY): Payer: Self-pay | Admitting: *Deleted

## 2015-05-05 ENCOUNTER — Inpatient Hospital Stay (HOSPITAL_COMMUNITY)
Admission: AD | Admit: 2015-05-05 | Discharge: 2015-05-05 | Disposition: A | Discharge status: Home / Self Care | Payer: BLUE CROSS/BLUE SHIELD | Source: Ambulatory Visit | Attending: Obstetrics & Gynecology | Admitting: Obstetrics & Gynecology

## 2015-05-05 DIAGNOSIS — O0993 Supervision of high risk pregnancy, unspecified, third trimester: Secondary | ICD-10-CM

## 2015-05-05 DIAGNOSIS — R102 Pelvic and perineal pain: Secondary | ICD-10-CM | POA: Insufficient documentation

## 2015-05-05 DIAGNOSIS — Z3A40 40 weeks gestation of pregnancy: Secondary | ICD-10-CM | POA: Insufficient documentation

## 2015-05-05 DIAGNOSIS — O26893 Other specified pregnancy related conditions, third trimester: Secondary | ICD-10-CM | POA: Diagnosis not present

## 2015-05-05 NOTE — Discharge Instructions (Signed)
Keep your scheduled appointment for prenatal care. Drink 8-10 glasses of water per day. Return to MAU as needed.

## 2015-05-05 NOTE — MAU Note (Signed)
Pt C/O uc's since yesterday, very painful.  States she has very small amount of bleeding, denies LOF, a lot of pelvic pressure.

## 2015-05-06 ENCOUNTER — Encounter (HOSPITAL_COMMUNITY): Payer: Self-pay | Admitting: *Deleted

## 2015-05-06 ENCOUNTER — Encounter: Payer: BLUE CROSS/BLUE SHIELD | Admitting: Family Medicine

## 2015-05-06 ENCOUNTER — Inpatient Hospital Stay (HOSPITAL_COMMUNITY)
Admission: AD | Admit: 2015-05-06 | Discharge: 2015-05-06 | Disposition: A | Discharge status: Home / Self Care | Payer: BLUE CROSS/BLUE SHIELD | Source: Ambulatory Visit | Attending: Family Medicine | Admitting: Family Medicine

## 2015-05-06 ENCOUNTER — Other Ambulatory Visit: Payer: Self-pay | Admitting: Family Medicine

## 2015-05-06 ENCOUNTER — Telehealth (HOSPITAL_COMMUNITY): Payer: Self-pay | Admitting: *Deleted

## 2015-05-06 DIAGNOSIS — O0993 Supervision of high risk pregnancy, unspecified, third trimester: Secondary | ICD-10-CM

## 2015-05-06 DIAGNOSIS — O479 False labor, unspecified: Secondary | ICD-10-CM

## 2015-05-06 MED ORDER — OXYCODONE-ACETAMINOPHEN 5-325 MG PO TABS: 1.0000 | ORAL_TABLET | Freq: Four times a day (QID) | ORAL | Status: DC | PRN

## 2015-05-06 MED ORDER — ZOLPIDEM TARTRATE 5 MG PO TABS: 5.0000 mg | ORAL_TABLET | Freq: Every evening | ORAL | Status: AC | PRN

## 2015-05-06 NOTE — Discharge Instructions (Signed)

## 2015-05-06 NOTE — Progress Notes (Signed)
Patient missed routine [facility] visit because she was in the MAU.   She has been having ctx every 2-3 minutes for the lst 24 hours but regular contractions for at least 2 days. She was evaluated in MAU and found to be 1 cm dilated.   Patient in early latent labor. Offered therapuetic rest with percocet and ambien through the use of the translator. I scheduled her PD-IOL for Friday 9/1 when she is 41w 1d which was the earliest available. We discussed kick counts and signs of active labor. She voice understanding. The patient had and NST today in the MAU thus will be < 5 days between antenatal testing on Friday and therefore does not necessitate any additional testing prior to IOL.   Caren Macadam, MD

## 2015-05-06 NOTE — Telephone Encounter (Signed)
Preadmission screen Interpreter number [phone]

## 2015-05-06 NOTE — MAU Note (Signed)
Contractions got stronger and closer, kicked in more last night.  Denies bleeding or leaking.

## 2015-05-07 ENCOUNTER — Inpatient Hospital Stay (HOSPITAL_COMMUNITY)
Admission: AD | Admit: 2015-05-07 | Discharge: 2015-05-10 | DRG: 766 | Disposition: A | Discharge status: Home / Self Care | Payer: BLUE CROSS/BLUE SHIELD | Source: Ambulatory Visit | Attending: Obstetrics & Gynecology | Admitting: Obstetrics & Gynecology

## 2015-05-07 ENCOUNTER — Inpatient Hospital Stay (HOSPITAL_COMMUNITY): Payer: BLUE CROSS/BLUE SHIELD | Admitting: Anesthesiology

## 2015-05-07 ENCOUNTER — Encounter (HOSPITAL_COMMUNITY): Payer: Self-pay | Admitting: *Deleted

## 2015-05-07 ENCOUNTER — Encounter (HOSPITAL_COMMUNITY)
Admission: AD | Disposition: A | Discharge status: Home / Self Care | Payer: Self-pay | Source: Ambulatory Visit | Attending: Obstetrics & Gynecology

## 2015-05-07 DIAGNOSIS — O480 Post-term pregnancy: Secondary | ICD-10-CM | POA: Diagnosis present

## 2015-05-07 DIAGNOSIS — O3413 Maternal care for benign tumor of corpus uteri, third trimester: Secondary | ICD-10-CM

## 2015-05-07 DIAGNOSIS — O479 False labor, unspecified: Secondary | ICD-10-CM

## 2015-05-07 DIAGNOSIS — Z843 Family history of consanguinity: Secondary | ICD-10-CM | POA: Diagnosis not present

## 2015-05-07 DIAGNOSIS — D259 Leiomyoma of uterus, unspecified: Secondary | ICD-10-CM | POA: Diagnosis present

## 2015-05-07 DIAGNOSIS — O3410 Maternal care for benign tumor of corpus uteri, unspecified trimester: Secondary | ICD-10-CM | POA: Diagnosis present

## 2015-05-07 DIAGNOSIS — Z3A40 40 weeks gestation of pregnancy: Secondary | ICD-10-CM | POA: Diagnosis present

## 2015-05-07 DIAGNOSIS — O0993 Supervision of high risk pregnancy, unspecified, third trimester: Secondary | ICD-10-CM

## 2015-05-07 DIAGNOSIS — Z98891 History of uterine scar from previous surgery: Secondary | ICD-10-CM

## 2015-05-07 DIAGNOSIS — O09523 Supervision of elderly multigravida, third trimester: Secondary | ICD-10-CM | POA: Diagnosis not present

## 2015-05-07 DIAGNOSIS — IMO0001 Reserved for inherently not codable concepts without codable children: Secondary | ICD-10-CM | POA: Insufficient documentation

## 2015-05-07 LAB — CBC
HCT: 35.7 % — ABNORMAL LOW (ref 36.0–46.0)
HEMOGLOBIN: 12.4 g/dL (ref 12.0–15.0)
MCH: 30 pg (ref 26.0–34.0)
MCHC: 34.7 g/dL (ref 30.0–36.0)
MCV: 86.4 fL (ref 78.0–100.0)
Platelets: 252 10*3/uL (ref 150–400)
RBC: 4.13 MIL/uL (ref 3.87–5.11)
RDW: 15 % (ref 11.5–15.5)
WBC: 12.8 10*3/uL — AB (ref 4.0–10.5)

## 2015-05-07 LAB — TYPE AND SCREEN
ABO/RH(D): O POS
ANTIBODY SCREEN: NEGATIVE

## 2015-05-07 LAB — ABO/RH: ABO/RH(D): O POS

## 2015-05-07 SURGERY — Surgical Case
Anesthesia: Epidural | Site: Abdomen

## 2015-05-07 MED ORDER — NALBUPHINE HCL 10 MG/ML IJ SOLN
5.0000 mg | Freq: Once | INTRAMUSCULAR | Status: DC | PRN
  Filled 2015-05-07: qty 0.5

## 2015-05-07 MED ORDER — LIDOCAINE HCL (PF) 1 % IJ SOLN
INTRAMUSCULAR | Status: DC | PRN
  Administered 2015-05-07 (×2): 4 mL via EPIDURAL

## 2015-05-07 MED ORDER — FENTANYL CITRATE (PF) 100 MCG/2ML IJ SOLN
INTRAMUSCULAR | Status: DC | PRN
  Administered 2015-05-07: 50 ug via INTRAVENOUS

## 2015-05-07 MED ORDER — DIPHENHYDRAMINE HCL 50 MG/ML IJ SOLN: 12.5000 mg | INTRAMUSCULAR | Status: DC | PRN

## 2015-05-07 MED ORDER — SIMETHICONE 80 MG PO CHEW
80.0000 mg | CHEWABLE_TABLET | ORAL | Status: DC
  Administered 2015-05-07 – 2015-05-10 (×3): 80 mg via ORAL
  Filled 2015-05-07 (×5): qty 1

## 2015-05-07 MED ORDER — ONDANSETRON HCL 4 MG/2ML IJ SOLN
INTRAMUSCULAR | Status: AC
Start: 2015-05-07 — End: 2015-05-07
  Filled 2015-05-07: qty 2

## 2015-05-07 MED ORDER — SODIUM BICARBONATE 8.4 % IV SOLN
INTRAVENOUS | Status: AC
  Filled 2015-05-07: qty 50

## 2015-05-07 MED ORDER — 0.9 % SODIUM CHLORIDE (POUR BTL) OPTIME
TOPICAL | Status: DC | PRN
  Administered 2015-05-07: 1000 mL

## 2015-05-07 MED ORDER — OXYCODONE-ACETAMINOPHEN 5-325 MG PO TABS: 1.0000 | ORAL_TABLET | ORAL | Status: DC | PRN

## 2015-05-07 MED ORDER — IBUPROFEN 600 MG PO TABS
600.0000 mg | ORAL_TABLET | Freq: Four times a day (QID) | ORAL | Status: DC | PRN
  Administered 2015-05-09: 600 mg via ORAL

## 2015-05-07 MED ORDER — SODIUM CHLORIDE 0.9 % IJ SOLN: 3.0000 mL | INTRAMUSCULAR | Status: DC | PRN

## 2015-05-07 MED ORDER — CEFAZOLIN SODIUM-DEXTROSE 2-3 GM-% IV SOLR
INTRAVENOUS | Status: DC | PRN
  Administered 2015-05-07: 2 g via INTRAVENOUS

## 2015-05-07 MED ORDER — KETOROLAC TROMETHAMINE 30 MG/ML IJ SOLN
30.0000 mg | Freq: Four times a day (QID) | INTRAMUSCULAR | Status: AC | PRN
  Administered 2015-05-07 – 2015-05-08 (×2): 30 mg via INTRAVENOUS
  Filled 2015-05-07: qty 1

## 2015-05-07 MED ORDER — PHENYLEPHRINE 40 MCG/ML (10ML) SYRINGE FOR IV PUSH (FOR BLOOD PRESSURE SUPPORT)
80.0000 ug | PREFILLED_SYRINGE | INTRAVENOUS | Status: DC | PRN
  Filled 2015-05-07: qty 20

## 2015-05-07 MED ORDER — NALOXONE HCL 1 MG/ML IJ SOLN
1.0000 ug/kg/h | INTRAVENOUS | Status: DC | PRN
  Filled 2015-05-07: qty 2

## 2015-05-07 MED ORDER — FENTANYL 2.5 MCG/ML BUPIVACAINE 1/10 % EPIDURAL INFUSION (WH - ANES)
14.0000 mL/h | INTRAMUSCULAR | Status: DC | PRN
Start: 2015-05-07 — End: 2015-05-07
  Administered 2015-05-07: 14 mL/h via EPIDURAL
  Filled 2015-05-07: qty 125

## 2015-05-07 MED ORDER — KETOROLAC TROMETHAMINE 30 MG/ML IJ SOLN: 30.0000 mg | Freq: Four times a day (QID) | INTRAMUSCULAR | Status: AC | PRN

## 2015-05-07 MED ORDER — CITRIC ACID-SODIUM CITRATE 334-500 MG/5ML PO SOLN
30.0000 mL | ORAL | Status: DC | PRN
  Administered 2015-05-07: 30 mL via ORAL
  Filled 2015-05-07: qty 15

## 2015-05-07 MED ORDER — LIDOCAINE-EPINEPHRINE (PF) 2 %-1:200000 IJ SOLN
INTRAMUSCULAR | Status: AC
  Filled 2015-05-07: qty 20

## 2015-05-07 MED ORDER — ONDANSETRON HCL 4 MG/2ML IJ SOLN: 4.0000 mg | Freq: Three times a day (TID) | INTRAMUSCULAR | Status: DC | PRN

## 2015-05-07 MED ORDER — DIPHENHYDRAMINE HCL 25 MG PO CAPS
25.0000 mg | ORAL_CAPSULE | ORAL | Status: DC | PRN
  Filled 2015-05-07: qty 1

## 2015-05-07 MED ORDER — SCOPOLAMINE 1 MG/3DAYS TD PT72
1.0000 | MEDICATED_PATCH | Freq: Once | TRANSDERMAL | Status: DC
  Filled 2015-05-07: qty 1

## 2015-05-07 MED ORDER — LACTATED RINGERS IV SOLN
INTRAVENOUS | Status: DC | PRN
  Administered 2015-05-07: 17:00:00 via INTRAVENOUS

## 2015-05-07 MED ORDER — BUPIVACAINE HCL (PF) 0.5 % IJ SOLN
INTRAMUSCULAR | Status: AC
  Filled 2015-05-07: qty 30

## 2015-05-07 MED ORDER — NALBUPHINE HCL 10 MG/ML IJ SOLN
5.0000 mg | INTRAMUSCULAR | Status: DC | PRN
  Filled 2015-05-07: qty 0.5

## 2015-05-07 MED ORDER — PRENATAL MULTIVITAMIN CH
1.0000 | ORAL_TABLET | Freq: Every day | ORAL | Status: DC
  Administered 2015-05-08 – 2015-05-10 (×3): 1 via ORAL
  Filled 2015-05-07 (×4): qty 1

## 2015-05-07 MED ORDER — OXYCODONE-ACETAMINOPHEN 5-325 MG PO TABS
1.0000 | ORAL_TABLET | ORAL | Status: DC | PRN
  Administered 2015-05-08 – 2015-05-10 (×6): 1 via ORAL
  Filled 2015-05-07 (×6): qty 1

## 2015-05-07 MED ORDER — SODIUM BICARBONATE 8.4 % IV SOLN
INTRAVENOUS | Status: DC | PRN
  Administered 2015-05-07 (×2): 5 mL via EPIDURAL
  Administered 2015-05-07 (×2): 3 mL via EPIDURAL

## 2015-05-07 MED ORDER — VITAMIN K1 1 MG/0.5ML IJ SOLN
INTRAMUSCULAR | Status: AC
  Filled 2015-05-07: qty 0.5

## 2015-05-07 MED ORDER — MEPERIDINE HCL 25 MG/ML IJ SOLN: 6.2500 mg | INTRAMUSCULAR | Status: DC | PRN

## 2015-05-07 MED ORDER — SIMETHICONE 80 MG PO CHEW
80.0000 mg | CHEWABLE_TABLET | Freq: Three times a day (TID) | ORAL | Status: DC
  Administered 2015-05-08 – 2015-05-10 (×7): 80 mg via ORAL
  Filled 2015-05-07 (×11): qty 1

## 2015-05-07 MED ORDER — OXYTOCIN 10 UNIT/ML IJ SOLN
INTRAMUSCULAR | Status: AC
  Filled 2015-05-07: qty 4

## 2015-05-07 MED ORDER — OXYTOCIN 40 UNITS IN LACTATED RINGERS INFUSION - SIMPLE MED
INTRAVENOUS | Status: DC | PRN
  Administered 2015-05-07: 40 mL via INTRAVENOUS

## 2015-05-07 MED ORDER — KETOROLAC TROMETHAMINE 30 MG/ML IJ SOLN
INTRAMUSCULAR | Status: AC
  Filled 2015-05-07: qty 1

## 2015-05-07 MED ORDER — MORPHINE SULFATE 0.5 MG/ML IJ SOLN
INTRAMUSCULAR | Status: AC
  Filled 2015-05-07: qty 100

## 2015-05-07 MED ORDER — FENTANYL 2.5 MCG/ML BUPIVACAINE 1/10 % EPIDURAL INFUSION (WH - ANES)
14.0000 mL/h | INTRAMUSCULAR | Status: DC | PRN
  Administered 2015-05-07: 14 mL/h via EPIDURAL

## 2015-05-07 MED ORDER — SENNOSIDES-DOCUSATE SODIUM 8.6-50 MG PO TABS
2.0000 | ORAL_TABLET | ORAL | Status: DC
  Administered 2015-05-07 – 2015-05-10 (×3): 2 via ORAL
  Filled 2015-05-07 (×5): qty 2

## 2015-05-07 MED ORDER — WITCH HAZEL-GLYCERIN EX PADS: 1.0000 "application " | MEDICATED_PAD | CUTANEOUS | Status: DC | PRN

## 2015-05-07 MED ORDER — LANOLIN HYDROUS EX OINT: 1.0000 "application " | TOPICAL_OINTMENT | CUTANEOUS | Status: DC | PRN

## 2015-05-07 MED ORDER — OXYTOCIN BOLUS FROM INFUSION: 500.0000 mL | INTRAVENOUS | Status: DC

## 2015-05-07 MED ORDER — PHENYLEPHRINE HCL 10 MG/ML IJ SOLN
INTRAMUSCULAR | Status: DC | PRN
  Administered 2015-05-07 (×3): 80 ug via INTRAVENOUS

## 2015-05-07 MED ORDER — DIBUCAINE 1 % RE OINT: 1.0000 "application " | TOPICAL_OINTMENT | RECTAL | Status: DC | PRN

## 2015-05-07 MED ORDER — PHENYLEPHRINE 40 MCG/ML (10ML) SYRINGE FOR IV PUSH (FOR BLOOD PRESSURE SUPPORT)
80.0000 ug | PREFILLED_SYRINGE | INTRAVENOUS | Status: DC | PRN
Start: 2015-05-07 — End: 2015-05-07
  Administered 2015-05-07 (×2): 80 ug via INTRAVENOUS

## 2015-05-07 MED ORDER — LACTATED RINGERS IV SOLN
INTRAVENOUS | Status: DC
  Administered 2015-05-07 (×6): via INTRAVENOUS

## 2015-05-07 MED ORDER — MENTHOL 3 MG MT LOZG
1.0000 | LOZENGE | OROMUCOSAL | Status: DC | PRN
  Filled 2015-05-07: qty 9

## 2015-05-07 MED ORDER — ACETAMINOPHEN 325 MG PO TABS: 650.0000 mg | ORAL_TABLET | ORAL | Status: DC | PRN

## 2015-05-07 MED ORDER — IBUPROFEN 600 MG PO TABS
600.0000 mg | ORAL_TABLET | Freq: Four times a day (QID) | ORAL | Status: DC
  Administered 2015-05-08 – 2015-05-10 (×8): 600 mg via ORAL
  Filled 2015-05-07 (×9): qty 1

## 2015-05-07 MED ORDER — OXYTOCIN 40 UNITS IN LACTATED RINGERS INFUSION - SIMPLE MED: 62.5000 mL/h | INTRAVENOUS | Status: AC

## 2015-05-07 MED ORDER — EPHEDRINE 5 MG/ML INJ: 10.0000 mg | INTRAVENOUS | Status: DC | PRN

## 2015-05-07 MED ORDER — FENTANYL CITRATE (PF) 100 MCG/2ML IJ SOLN: 25.0000 ug | INTRAMUSCULAR | Status: DC | PRN

## 2015-05-07 MED ORDER — LACTATED RINGERS IV SOLN
INTRAVENOUS | Status: DC
  Administered 2015-05-07: 21:00:00 via INTRAVENOUS

## 2015-05-07 MED ORDER — OXYTOCIN 40 UNITS IN LACTATED RINGERS INFUSION - SIMPLE MED
62.5000 mL/h | INTRAVENOUS | Status: DC
  Filled 2015-05-07: qty 1000

## 2015-05-07 MED ORDER — ERYTHROMYCIN 5 MG/GM OP OINT
TOPICAL_OINTMENT | OPHTHALMIC | Status: AC
Start: 2015-05-07 — End: 2015-05-08
  Filled 2015-05-07: qty 1

## 2015-05-07 MED ORDER — OXYCODONE-ACETAMINOPHEN 5-325 MG PO TABS: 2.0000 | ORAL_TABLET | ORAL | Status: DC | PRN

## 2015-05-07 MED ORDER — SIMETHICONE 80 MG PO CHEW
80.0000 mg | CHEWABLE_TABLET | ORAL | Status: DC | PRN
  Filled 2015-05-07: qty 1

## 2015-05-07 MED ORDER — LACTATED RINGERS IV SOLN
INTRAVENOUS | Status: DC
  Administered 2015-05-07: 12:00:00 via INTRAUTERINE

## 2015-05-07 MED ORDER — OXYTOCIN 40 UNITS IN LACTATED RINGERS INFUSION - SIMPLE MED
1.0000 m[IU]/min | INTRAVENOUS | Status: DC
  Administered 2015-05-07: 1 m[IU]/min via INTRAVENOUS

## 2015-05-07 MED ORDER — ACETAMINOPHEN 325 MG PO TABS
650.0000 mg | ORAL_TABLET | ORAL | Status: DC | PRN
  Administered 2015-05-07: 650 mg via ORAL
  Filled 2015-05-07: qty 2

## 2015-05-07 MED ORDER — ONDANSETRON HCL 4 MG/2ML IJ SOLN
INTRAMUSCULAR | Status: DC | PRN
Start: 2015-05-07 — End: 2015-05-07
  Administered 2015-05-07: 4 mg via INTRAVENOUS

## 2015-05-07 MED ORDER — TERBUTALINE SULFATE 1 MG/ML IJ SOLN
INTRAMUSCULAR | Status: AC
  Administered 2015-05-07: 0.25 mg
  Filled 2015-05-07: qty 1

## 2015-05-07 MED ORDER — TETANUS-DIPHTH-ACELL PERTUSSIS 5-2.5-18.5 LF-MCG/0.5 IM SUSP: 0.5000 mL | Freq: Once | INTRAMUSCULAR | Status: DC

## 2015-05-07 MED ORDER — MORPHINE SULFATE (PF) 0.5 MG/ML IJ SOLN
INTRAMUSCULAR | Status: DC | PRN
  Administered 2015-05-07: 3 mg via EPIDURAL
  Administered 2015-05-07: 2 mg via INTRAVENOUS

## 2015-05-07 MED ORDER — ZOLPIDEM TARTRATE 5 MG PO TABS: 5.0000 mg | ORAL_TABLET | Freq: Every evening | ORAL | Status: DC | PRN

## 2015-05-07 MED ORDER — NALOXONE HCL 0.4 MG/ML IJ SOLN: 0.4000 mg | INTRAMUSCULAR | Status: DC | PRN

## 2015-05-07 MED ORDER — BUPIVACAINE HCL (PF) 0.5 % IJ SOLN
INTRAMUSCULAR | Status: DC | PRN
  Administered 2015-05-07: 30 mL

## 2015-05-07 MED ORDER — ONDANSETRON HCL 4 MG/2ML IJ SOLN: 4.0000 mg | Freq: Four times a day (QID) | INTRAMUSCULAR | Status: DC | PRN

## 2015-05-07 MED ORDER — PHENYLEPHRINE 40 MCG/ML (10ML) SYRINGE FOR IV PUSH (FOR BLOOD PRESSURE SUPPORT)
PREFILLED_SYRINGE | INTRAVENOUS | Status: AC
Start: 2015-05-07 — End: 2015-05-07
  Filled 2015-05-07: qty 30

## 2015-05-07 MED ORDER — FENTANYL CITRATE (PF) 100 MCG/2ML IJ SOLN
INTRAMUSCULAR | Status: AC
  Filled 2015-05-07: qty 4

## 2015-05-07 MED ORDER — CEFAZOLIN SODIUM-DEXTROSE 2-3 GM-% IV SOLR
INTRAVENOUS | Status: AC
  Filled 2015-05-07: qty 50

## 2015-05-07 MED ORDER — LIDOCAINE HCL (PF) 1 % IJ SOLN: 30.0000 mL | INTRAMUSCULAR | Status: DC | PRN

## 2015-05-07 MED ORDER — DIPHENHYDRAMINE HCL 25 MG PO CAPS
25.0000 mg | ORAL_CAPSULE | Freq: Four times a day (QID) | ORAL | Status: DC | PRN
  Filled 2015-05-07: qty 1

## 2015-05-07 MED ORDER — LACTATED RINGERS IV SOLN
500.0000 mL | INTRAVENOUS | Status: DC | PRN
  Administered 2015-05-07 (×3): 500 mL via INTRAVENOUS

## 2015-05-07 SURGICAL SUPPLY — 30 items
BENZOIN TINCTURE PRP APPL 2/3 (GAUZE/BANDAGES/DRESSINGS) ×3 IMPLANT
CLAMP CORD UMBIL (MISCELLANEOUS) ×3 IMPLANT
CLOSURE WOUND 1/2 X4 (GAUZE/BANDAGES/DRESSINGS) ×1
CLOTH BEACON ORANGE TIMEOUT ST (SAFETY) ×3 IMPLANT
DRAPE SHEET LG 3/4 BI-LAMINATE (DRAPES) ×3 IMPLANT
DRSG OPSITE POSTOP 4X10 (GAUZE/BANDAGES/DRESSINGS) ×3 IMPLANT
DURAPREP 26ML APPLICATOR (WOUND CARE) ×3 IMPLANT
ELECT REM PT RETURN 9FT ADLT (ELECTROSURGICAL) ×3
ELECTRODE REM PT RTRN 9FT ADLT (ELECTROSURGICAL) ×1 IMPLANT
GLOVE BIOGEL PI IND STRL 7.0 (GLOVE) ×2 IMPLANT
GLOVE BIOGEL PI INDICATOR 7.0 (GLOVE) ×4
GLOVE ECLIPSE 7.0 STRL STRAW (GLOVE) ×3 IMPLANT
GOWN STRL REUS W/TWL LRG LVL3 (GOWN DISPOSABLE) ×6 IMPLANT
NEEDLE HYPO 22GX1.5 SAFETY (NEEDLE) ×3 IMPLANT
NEEDLE HYPO 25X5/8 SAFETYGLIDE (NEEDLE) ×3 IMPLANT
NS IRRIG 1000ML POUR BTL (IV SOLUTION) ×3 IMPLANT
PACK C SECTION WH (CUSTOM PROCEDURE TRAY) ×3 IMPLANT
PAD ABD 7.5X8 STRL (GAUZE/BANDAGES/DRESSINGS) ×3 IMPLANT
PAD OB MATERNITY 4.3X12.25 (PERSONAL CARE ITEMS) ×3 IMPLANT
PENCIL SMOKE EVAC W/HOLSTER (ELECTROSURGICAL) ×3 IMPLANT
RTRCTR C-SECT PINK 25CM LRG (MISCELLANEOUS) ×3 IMPLANT
SPONGE LAP 18X18 X RAY DECT (DISPOSABLE) ×3 IMPLANT
STRIP CLOSURE SKIN 1/2X4 (GAUZE/BANDAGES/DRESSINGS) ×2 IMPLANT
SUT PLAIN 2 0 XLH (SUTURE) ×3 IMPLANT
SUT VIC AB 0 CT1 36 (SUTURE) ×6 IMPLANT
SUT VIC AB 0 CTX 36 (SUTURE) ×6
SUT VIC AB 0 CTX36XBRD ANBCTRL (SUTURE) ×3 IMPLANT
SUT VIC AB 4-0 KS 27 (SUTURE) ×3 IMPLANT
SYR CONTROL 10ML LL (SYRINGE) ×3 IMPLANT
TOWEL OR 17X24 6PK STRL BLUE (TOWEL DISPOSABLE) ×3 IMPLANT

## 2015-05-07 NOTE — Brief Op Note (Signed)
05/07/2015  6:31 PM  PATIENT:  Virginia Salas  39 y.o. female  PRE-OPERATIVE DIAGNOSIS:  non-reasuring fetal heart rate, positive contraction stress test  POST-OPERATIVE DIAGNOSIS:  non-reasuring fetal heart rate, positive contraction stress test  PROCEDURE:  Procedure(s): CESAREAN SECTION (N/A)  SURGEON:  Surgeon(s) and Role:    * Osborne Oman, MD - Primary    * Jerrell Belfast. Ernestina Patches, MD   ANESTHESIA:   epidural  EBL:  Total I/O In: 3600 [I.V.:3600] Out: 2375 [Urine:875; Blood:1500]  BLOOD ADMINISTERED:none  DRAINS: none   LOCAL MEDICATIONS USED:  MARCAINE  30cc  SPECIMEN:  Placenta  DISPOSITION OF SPECIMEN:  PATHOLOGY  COUNTS:  YES  TOURNIQUET:  * No tourniquets in log *  DICTATION: .Note written in EPIC  PLAN OF CARE: Admit to inpatient   PATIENT DISPOSITION:  PACU - hemodynamically stable.   Delay start of Pharmacological VTE agent (>24hrs) due to surgical blood loss or risk of bleeding: not applicable

## 2015-05-07 NOTE — Anesthesia Procedure Notes (Signed)
Epidural Patient location during procedure: OB Start time: 05/07/2015 10:37 AM  Staffing Anesthesiologist: Josephine Igo Performed by: anesthesiologist   Preanesthetic Checklist Completed: patient identified, site marked, surgical consent, pre-op evaluation, timeout performed, IV checked, risks and benefits discussed and monitors and equipment checked  Epidural Patient position: sitting Prep: site prepped and draped and DuraPrep Patient monitoring: continuous pulse ox and blood pressure Approach: midline Location: L4-L5 Injection technique: LOR air  Needle:  Needle type: Tuohy  Needle gauge: 17 G Needle length: 9 cm and 9 Needle insertion depth: 5 cm cm Catheter type: closed end flexible Catheter size: 19 Gauge Catheter at skin depth: 10 cm Test dose: negative and Other  Assessment Events: blood not aspirated, injection not painful, no injection resistance, negative IV test and no paresthesia  Additional Notes Patient identified. Risks and benefits discussed including failed block, incomplete  Pain control, post dural puncture headache, nerve damage, paralysis, blood pressure Changes, nausea, vomiting, reactions to medications-both toxic and allergic and post Partum back pain. All questions were answered. Patient expressed understanding and wished to proceed. Sterile technique was used throughout procedure. Epidural site was Dressed with sterile barrier dressing. No paresthesias, signs of intravascular injection Or signs of intrathecal spread were encountered.  Patient was more comfortable after the epidural was dosed. Please see RN's note for documentation of vital signs and FHR which are stable.

## 2015-05-07 NOTE — H&P (Signed)
OBSTETRIC ADMISSION HISTORY AND PHYSICAL  Virginia Salas is a 39 y.o. female G3P0020 with IUP at [redacted]w[redacted]d by L presenting for L/10. Contractions for 3 days, increased intensity.  She initially presented for LOF but fern was negative and spec exam showed BB in MAU. She reports +FMs, No LOF, no VB, no blurry vision, headaches or peripheral edema, and RUQ pain.  She plans on breast/bottle feeding. She request Unsure for birth control.  Dating: By L/10--->  Estimated Date of Delivery: 05/03/15  Clinic  [facility] Prenatal Labs  Dating  LMP consistent with 10 wk ultrasound Blood type: O/Positive/-- (02/11 0000)   Genetic Screen  Genetic counseling; quad neg, declined amnio and NIPS Antibody:Negative (02/11 0000)  Anatomic [place] Nl, fibroids up to 4.2 cm Rubella: Immune (02/11 0000)  GTT Early:               Third trimester: 105 RPR: Nonreactive (02/11 0000)   Flu vaccine  Health Dept HBsAg: Negative (02/11 0000)   TDaP vaccine  Declined                                             Rhogam: HIV: Non-reactive (02/11 0000)   GBS   Neg                                           (For PCN allergy, check sensitivities) GBS: Neg  Baby Food  Breast Pap: negative 10/18/14  Contraception    Circumcision  Undecided   Pediatrician  Undecided   Support Person  Zena Amos (Maudry Diego)    Prenatal History/Complications:  Past Medical History: Past Medical History  Diagnosis Date  . Medical history non-contributory     Past Surgical History: Past Surgical History  Procedure Laterality Date  . No past surgeries      Obstetrical History: OB History    Gravida Para Term Preterm AB TAB SAB Ectopic Multiple Living   3    2  2    0      Social History: Social History   Social History  . Marital Status: Married    Spouse Name: N/A  . Number of Children: N/A  . Years of Education: N/A   Social History Main Topics  . Smoking status: Never Smoker   . Smokeless tobacco: Never Used  . Alcohol Use: No  . Drug Use: No  .  Sexual Activity: Yes   Other Topics Concern  . None   Social History Narrative    Family History: History reviewed. No pertinent family history.  Allergies: No Known Allergies  Prescriptions prior to admission  Medication Sig Dispense Refill Last Dose  . oxyCODONE-acetaminophen (PERCOCET/ROXICET) 5-325 MG per tablet Take 1-2 tablets by mouth every 6 (six) hours as needed. 10 tablet 0   . Prenatal Vit-Fe Fumarate-FA (PRENATAL MULTIVITAMIN) TABS tablet Take 1 tablet by mouth daily at 12 noon.   05/06/2015 at Unknown time  . zolpidem (AMBIEN) 5 MG tablet Take 1 tablet (5 mg total) by mouth at bedtime as needed for sleep. 3 tablet 0    Review of Systems   All systems reviewed and negative except as stated in HPI  Blood pressure 120/64, pulse 76, temperature 98.6 F (37 C), temperature source Oral, resp. rate 18, last menstrual  period 07/27/2014. General appearance: alert and cooperative. Pleasant, arabic speaking Lungs: clear to auscultation bilaterally Heart: regular rate and rhythm Abdomen: soft, non-tender; bowel sounds normal Pelvic: adequate Extremities: Homans sign is negative, no sign of DVT  Presentation: cephalic Fetal monitoringBaseline: 135 bpm, Variability: Good {> 6 bpm) and Accelerations: Reactive Uterine activityDate/time of onset: 3 days ago  Dilation: 6 Effacement (%): 100 Station: -2 Exam by:: Dr. Ernestina Patches   Prenatal labs: ABO, Rh: O/Positive/-- (02/11 0000) Antibody: Negative (02/11 0000) Rubella:   RPR: Nonreactive (06/13 0000)  HBsAg: Negative (02/11 0000)  HIV: Non-reactive (06/13 0000)  GBS: Negative (08/04 0000)   Prenatal Transfer Tool  Maternal Diabetes: No Genetic Screening: Normal- Quad negative and declined Amnio and NIPS Maternal Ultrasounds/Referrals: Normal Fetal Ultrasounds or other Referrals:  None Maternal Substance Abuse:  No Significant Maternal Medications:  None Significant Maternal Lab Results: Lab values include: Group B  Strep negative. Consanguinity-Mother and father are first cousins.   No results found for this or any previous visit (from the past 24 hour(s)).  Patient Active Problem List   Diagnosis Date Noted  . Active labor at term 05/07/2015  . Supervision of high risk pregnancy, antepartum 03/15/2015  . Uterine fibroids affecting pregnancy in third trimester, antepartum   . AMA (advanced maternal age) multigravida 80+   . Consanguinity   . Advanced maternal age in multigravida     Assessment: Virginia Salas is a 39 y.o. G3P0020 at [redacted]w[redacted]d here for active term labor  #Labor: expectant management. Can consider augmentation with ROM if ctx space out.  #Pain: Epidural upon request #FWB: Cat I #ID:  GBS neg #MOF: both breast bottle #MOC:unsure #Circ:  unsure  Caren Macadam 05/07/2015, 9:25 AM

## 2015-05-07 NOTE — Progress Notes (Signed)
Labor Progress Note  S: Patient was started on pitocin but immediately started to have recurrent late deceleration not responsive to neonatal intrauterine resuscitation efforts. Pitocin is being turned off, terbutaline being administered to stop contractions   O:  BP 128/64 mmHg  Pulse 86  Temp(Src) 98.8 F (37.1 C) (Oral)  Resp 20  SpO2 97%  LMP 07/27/2014 EFM: 160 bpm/moderate/no accelerations, late decelerations Toco: q2-3 minutes  CVE: Dilation: 8 Effacement (%): 70 Cervical Position: Middle Station: -2 Presentation: Vertex Exam by:: Ernestina Patches, MD   A&P: 39 y.o. G3P0020 at [redacted]w[redacted]d with positive contraction stress test/fetal intolerance of labor in protracted active phase of labor - arrest of dilation.  Unable to further augment so cesarean delivery recommended.  The risks of cesarean section discussed with the patient and family, with the help of phone Urdu medical interpreter, included but were not limited to: bleeding which may require transfusion or reoperation; infection which may require antibiotics; injury to bowel, bladder, ureters or other surrounding organs; injury to the fetus; need for additional procedures including hysterectomy in the event of a life-threatening hemorrhage; placental abnormalities wth subsequent pregnancies, incisional problems, thromboembolic phenomenon and other postoperative/anesthesia complications. The patient concurred with the proposed plan, giving informed written consent for the procedure.   Anesthesia and OR aware. Preoperative prophylactic antibiotics and SCDs ordered on call to the OR.  To OR when ready.   Osborne Oman, MD 4:42 PM

## 2015-05-07 NOTE — Transfer of Care (Signed)
Immediate Anesthesia Transfer of Care Note  Patient: Virginia Salas  Procedure(s) Performed: Procedure(s): CESAREAN SECTION (N/A)  Patient Location: PACU  Anesthesia Type:Epidural  Level of Consciousness: awake, alert  and oriented  Airway & Oxygen Therapy: Patient Spontanous Breathing  Post-op Assessment: Report given to RN and Post -op Vital signs reviewed and stable  Post vital signs: Reviewed and stable  Last Vitals:  Filed Vitals:   05/07/15 1646  BP: 123/50  Pulse: 163  Temp:   Resp:     Complications: No apparent anesthesia complications

## 2015-05-07 NOTE — Progress Notes (Signed)
Labor Progress Note  S: Patient continues to have occasional variable decelerations. Patient is feeling tired. Has multiple female family members at bedside.   O:  BP 114/70 mmHg  Pulse 86  Temp(Src) 98.8 F (37.1 C) (Oral)  Resp 20  SpO2 97%  LMP 07/27/2014 EFM: 145/mod/+accelerations, variable decelerations Toco: q3-4 minutes  CVE: Dilation: 8 Effacement (%): 90 Cervical Position: Middle Station: 0 Presentation: Vertex Exam by:: Allysson Rinehimer   A&P: 39 y.o. G3P0020 [redacted]w[redacted]d here in SOL #Labor: Contraction pattern is regular but not adequate. Will start Pitocin to augment. Suspect malpresentation (possible OP) #FWB: Cat II, low risk of acidemia and evolution (Parer et al.  Carmin Muskrat J Obstet Gynecol 2007). Conservative measures with positioning, amnioinfusion . Discussed with Attending physician Dr. Harolyn Rutherford #GBS negative  Caren Macadam, MD 4:02 PM

## 2015-05-07 NOTE — Anesthesia Preprocedure Evaluation (Addendum)
Anesthesia Evaluation  Patient identified by MRN, date of birth, ID band Patient awake    Reviewed: Allergy & Precautions, H&P , Patient's Chart, lab work & pertinent test results  Airway Mallampati: III  TM Distance: >3 FB Neck ROM: full    Dental no notable dental hx. (+) Teeth Intact   Pulmonary neg pulmonary ROS,  breath sounds clear to auscultation  Pulmonary exam normal       Cardiovascular negative cardio ROS Normal cardiovascular examRhythm:regular Rate:Normal     Neuro/Psych negative neurological ROS  negative psych ROS   GI/Hepatic negative GI ROS, Neg liver ROS,   Endo/Other  negative endocrine ROS  Renal/GU negative Renal ROS  negative genitourinary   Musculoskeletal   Abdominal   Peds  Hematology negative hematology ROS (+)   Anesthesia Other Findings   Reproductive/Obstetrics (+) Pregnancy                             Anesthesia Physical Anesthesia Plan  ASA: II and emergent  Anesthesia Plan: Epidural   Post-op Pain Management:    Induction:   Airway Management Planned: Natural Airway  Additional Equipment:   Intra-op Plan:   Post-operative Plan:   Informed Consent: I have reviewed the patients History and Physical, chart, labs and discussed the procedure including the risks, benefits and alternatives for the proposed anesthesia with the patient or authorized representative who has indicated his/her understanding and acceptance.     Plan Discussed with: Anesthesiologist, CRNA and Surgeon  Anesthesia Plan Comments: (Non reassuring FHR tracing for emergent C/Section. Will use epidural for C/Section. Dewaine Oats)       Anesthesia Quick Evaluation

## 2015-05-07 NOTE — Op Note (Signed)
Virginia Salas PROCEDURE DATE: 05/07/2015  PREOPERATIVE DIAGNOSES: Intrauterine pregnancy at [redacted]w[redacted]d weeks gestation, C-section for non-reassuring fetal heart tracing/fetal intolerance of labor  POSTOPERATIVE DIAGNOSES: The same  PROCEDURE:  Procedure(s): CESAREAN SECTION (N/A)  SURGEON:  Surgeon(s) and Role:    * Osborne Oman, MD - Primary    * Jerrell Belfast. Ernestina Patches, MD   ANESTHESIA:   epidural EBL:  Total I/O In: 3600 [I.V.:3600] Out: 2375 [Urine:875; Blood:1500]  BLOOD ADMINISTERED:none DRAINS: none  LOCAL MEDICATIONS USED:  MARCAINE  30cc SPECIMEN:  Placenta DISPOSITION OF SPECIMEN:  PATHOLOGY   INDICATIONS: Virginia Salas is a 39 y.o. G3P0020 at [redacted]w[redacted]d presented to the hospital in active labor which a prolonged latent phase. She progressed to active labor but then had protracted first stage. Fetal heart tracing showed recurrent variable decelerations and necessitated IUPC placement. After a reactive and reassuring strip she was found to be relatively unchanged on her cervical exam. She was started on pitocin due to lack of cervical change but this cause late and variable decelerations, thus a positive stress test indicating fetal intolerance of labor.  The risks of surgery were discussed with the patient including but were not limited to: bleeding which may require transfusion or reoperation; infection which may require antibiotics; injury to bowel, bladder, ureters or other surrounding organs; injury to the fetus; need for additional procedures including hysterectomy in the event of a life-threatening hemorrhage; placental abnormalities wth subsequent pregnancies, incisional problems, thromboembolic phenomenon and other postoperative/anesthesia complications.  The patient concurred with the proposed plan, giving informed written consent for the procedures.  Consent was obtained through the use of an interpreter.  FINDINGS:  Viable female infant in cephalic presentation, direct OP.  Apgars 9  and 9.  Clear amniotic fluid.  Intact placenta, three vessel cord.  Normal uterus, fallopian tubes and ovaries bilaterally.   PROCEDURE IN DETAIL:  The patient preoperatively received intravenous antibiotics and had sequential compression devices applied to her lower extremities.   She was then taken to the operating room where the epidural anesthesia was dosed up to surgical level and was found to be adequate. She was then placed in a dorsal supine position with a leftward tilt, and prepped and draped in a sterile manner.  A foley catheter was placed into her bladder and attached to constant gravity.  After an adequate timeout was performed, a Pfannenstiel skin incision was made with scalpel and carried through to the underlying layer of fascia. The fascia was incised in the midline, and this incision was extended bilaterally using the Mayo scissors.  Kocher clamps were applied to the superior aspect of the fascial incision and the underlying rectus muscles were dissected off bluntly and then using bovie. A similar process was carried out on the inferior aspect of the fascial incision. The rectus muscles were separated in the midline bluntly and the peritoneum was entered bluntly. Attention was turned to the lower uterine segment where a low transverse hysterotomy was made with a scalpel and extended bilaterally bluntly.  The infant was successfully delivered, the cord was clamped and cut and the infant was handed over to awaiting neonatology team. Uterine massage was then administered, and the placenta delivered intact with a three-vessel cord. The uterus was then cleared of clot and debris.  It was initially boggy with increased bleeding and responded to uterine massage and administration of pitocin. The hysterotomy was closed with 0 Vicryl in a running locked fashion, and an imbricating layer was also placed with 0 Vicryl.  Several figure-of-eight serosal stitches were placed to help with hemostasis.  The  pelvis was cleared of all clot and debris. Hemostasis was confirmed on all surfaces.  The peritoneum and the muscles were reapproximated using 0 Vicryl interrupted stitches. The fascia was then closed using 0 Vicryl in a running fashion.  The subcutaneous layer reapproximated with 2-0 plain gut interrupted stitches, and 30 ml of 0.5% Marcaine was injected subcutaneously around the incision.  The skin was closed with a 4-0 Vicryl subcuticular stitch using a Lanny Hurst needle. The patient tolerated the procedure well. Sponge, lap, instrument and needle counts were correct x 2.  She was taken to the recovery room in stable condition.   Caren Macadam, MD Family Medicine, OB Fellow [facility]

## 2015-05-07 NOTE — Progress Notes (Signed)
Labor Progress Note  S: Called to room for deep variables.   O:  BP 106/61 mmHg  Pulse 87  Temp(Src) 98.1 F (36.7 C) (Oral)  Resp 18  SpO2 97%  LMP 07/27/2014 FWB: 145/mod/+accels, deep variables to 90s with return to baseline.  CVE: Dilation: 8 Effacement (%): 100 Cervical Position: Middle Station: 0 Presentation: Vertex Exam by:: Jayvion Stefanski  A&P: 39 y.o. [id number] [redacted]w[redacted]d here in active labor #Labor: expectant managment #FWB: Cat II. replaced IUPC. Continue amnio. Reassured as infant had good variability #GBS neg  Caren Macadam, MD 1:26 PM

## 2015-05-07 NOTE — MAU Note (Addendum)
Possible ROM at 5/6 this morninig.  Clear fluid. Small amt of bleeding. Is having contractions

## 2015-05-08 ENCOUNTER — Encounter (HOSPITAL_COMMUNITY): Payer: Self-pay | Admitting: Obstetrics & Gynecology

## 2015-05-08 LAB — CBC
HCT: 26 % — ABNORMAL LOW (ref 36.0–46.0)
HCT: 27.4 % — ABNORMAL LOW (ref 36.0–46.0)
Hemoglobin: 8.7 g/dL — ABNORMAL LOW (ref 12.0–15.0)
Hemoglobin: 9.1 g/dL — ABNORMAL LOW (ref 12.0–15.0)
MCH: 29.3 pg (ref 26.0–34.0)
MCH: 29.5 pg (ref 26.0–34.0)
MCHC: 33.5 g/dL (ref 30.0–36.0)
MCHC: 33.2 g/dL (ref 30.0–36.0)
MCV: 87.5 fL (ref 78.0–100.0)
MCV: 89 fL (ref 78.0–100.0)
PLATELETS: 176 10*3/uL (ref 150–400)
PLATELETS: 190 10*3/uL (ref 150–400)
RBC: 2.97 MIL/uL — ABNORMAL LOW (ref 3.87–5.11)
RBC: 3.08 MIL/uL — ABNORMAL LOW (ref 3.87–5.11)
RDW: 15.4 % (ref 11.5–15.5)
RDW: 15.9 % — AB (ref 11.5–15.5)
WBC: 10.6 10*3/uL — ABNORMAL HIGH (ref 4.0–10.5)
WBC: 13.2 10*3/uL — AB (ref 4.0–10.5)

## 2015-05-08 LAB — RPR: RPR Ser Ql: NONREACTIVE

## 2015-05-08 LAB — HIV ANTIBODY (ROUTINE TESTING W REFLEX): HIV Screen 4th Generation wRfx: NONREACTIVE

## 2015-05-08 NOTE — Addendum Note (Signed)
Addendum  created 05/08/15 8127 by Hewitt Blade, CRNA   Modules edited: Notes Section   Notes Section:  File: [id number]

## 2015-05-08 NOTE — Progress Notes (Signed)
Post Partum Day /Post-Op #1 Subjective:  Virginia Salas is a 39 y.o. G3P0020 [redacted]w[redacted]d s/p pLTCS for non reassuring fetal heart tracing.  No acute events overnight.  Pt denies problems with ambulating, voiding or po intake.  She denies nausea or vomiting.  Pain is well controlled.  She has had flatus. She has not had bowel movement.  Lochia Small.  Plan for birth control is undecided.  Method of Feeding: Breast  Objective: Blood pressure 108/53, pulse 111, temperature 99.5 F (37.5 C), temperature source Oral, resp. rate 20, last menstrual period 07/27/2014, SpO2 98 %.  Physical Exam:  General: alert, cooperative and no distress Lochia:normal flow Chest: normal WOB Heart: RR Abdomen: +BS, soft, appropriately tender, L>R Uterine Fundus: firm Incision: clean/dry/intact DVT Evaluation: No evidence of DVT seen on physical exam. Extremities: no edema   Recent Labs  05/07/15 0945 05/08/15 0340  HGB 12.4 8.7*  HCT 35.7* 26.0*    Assessment/Plan:  ASSESSMENT: Virginia Salas is a 39 y.o. G3P0020 [redacted]w[redacted]d s/p pLTCS Routine CS care Expected drop in hgb based on EBL, not symptomatic.  Home POD#2 or 3    LOS: 1 day   Virginia Salas 05/08/2015, 9:08 AM

## 2015-05-08 NOTE — Anesthesia Postprocedure Evaluation (Signed)
  Anesthesia Post-op Note  Patient: Virginia Salas  Procedure(s) Performed: Procedure(s): CESAREAN SECTION (N/A)  Patient Location: Mother/Baby  Anesthesia Type:Spinal  Level of Consciousness: awake, alert  and oriented  Airway and Oxygen Therapy: Patient Spontanous Breathing  Post-op Pain: none  Post-op Assessment: Post-op Vital signs reviewed and Patient's Cardiovascular Status Stable LLE Motor Response: Purposeful movement LLE Sensation: Full sensation RLE Motor Response: Purposeful movement RLE Sensation: Full sensation      Post-op Vital Signs: Reviewed and stable  Last Vitals:  Filed Vitals:   05/08/15 0635  BP: 108/53  Pulse: 111  Temp:   Resp: 20    Complications: No apparent anesthesia complications

## 2015-05-08 NOTE — Anesthesia Postprocedure Evaluation (Signed)
  Anesthesia Post-op Note  Patient: Virginia Salas  Procedure(s) Performed: Procedure(s): CESAREAN SECTION (N/A)  Patient Location: PACU  Anesthesia Type:Epidural  Level of Consciousness: awake, alert  and oriented  Airway and Oxygen Therapy: Patient Spontanous Breathing  Post-op Pain: none  Post-op Assessment: Post-op Vital signs reviewed, Patient's Cardiovascular Status Stable, Respiratory Function Stable, Patent Airway, No signs of Nausea or vomiting, Pain level controlled, No headache, No backache, Spinal receding and Patient able to bend at knees LLE Motor Response: Purposeful movement LLE Sensation: Full sensation RLE Motor Response: Purposeful movement RLE Sensation: Full sensation      Post-op Vital Signs: Reviewed and stable  Last Vitals:    Complications: No apparent anesthesia complications

## 2015-05-08 NOTE — Lactation Note (Signed)
This note was copied from the chart of [place]. Lactation Consultation Note Initial visit at 24 hours of age.  Mom speaks Urdu and has cousin on the phone who interprets for her and has signed consent.  Mom has only given baby bottles of formula and asks if she has milk.  LC assisted with hand expression with colostrum visible.  Mom is Sudaneese with dark skin and nipples/ areolas are white with slight brown edges.  Mom reports this is normal color for her.  MBU Rn reports mom has similar pigmentation on her labia.  Mom denies breast changes during pregnancy and has small breast with limited compressible tissue.  Assisted with placing baby STS in football hold, baby is showing feeding cues.  Baby latched well with wide mouth, flanged lips and few rhythmic sucking bursts.  Stimulation needed to keep baby sucking.  Encouraged mom to breast feed baby first and then supplement as needed.  Mom given hand pump for breast stimulation with instructions and she is able to return demonstration. Discussed progression of milk supply. [facility] LC resources given and discussed briefly.  Encouraged to feed with early cues on demand. Encouraged to feed baby at least 8 times in 24 hours.   Early newborn behavior discussed.  Mom to call for assist as needed.       Patient Name: Virginia Salas [id number] Date: 05/08/2015 Reason for consult: Initial assessment   Maternal Data Has patient been taught Hand Expression?: Yes Does the patient have breastfeeding experience prior to this delivery?: No  Feeding Feeding Type: Breast Fed Length of feed:  (several minutes observed)  LATCH Score/Interventions Latch: Repeated attempts needed to sustain latch, nipple held in mouth throughout feeding, stimulation needed to elicit sucking reflex.  Audible Swallowing: A few with stimulation  Type of Nipple: Everted at rest and after stimulation  Comfort (Breast/Nipple): Soft / non-tender     Hold (Positioning): Assistance  needed to correctly position infant at breast and maintain latch. Intervention(s): Breastfeeding basics reviewed;Support Pillows;Position options;Skin to skin  LATCH Score: 7  Lactation Tools Discussed/Used Initiated by:: JS Date initiated:: 05/08/15   Consult Status Consult Status: Follow-up Date: 05/09/15 Follow-up type: In-patient    Justice Britain 05/08/2015, 6:23 PM

## 2015-05-09 MED ORDER — MAGNESIUM HYDROXIDE 400 MG/5ML PO SUSP
30.0000 mL | Freq: Every day | ORAL | Status: DC | PRN
Start: 2015-05-09 — End: 2015-05-10
  Administered 2015-05-09: 30 mL via ORAL
  Filled 2015-05-09: qty 30

## 2015-05-09 NOTE — Progress Notes (Signed)
POSTPARTUM PROGRESS NOTE  Post Partum Day 2  Subjective:  Virginia Salas is a 39 y.o. O1V6153 [redacted]w[redacted]d s/p pLTCS for AOD.  No acute events overnight. Complaining of mild abdominal distention, is passing flatus and with no n/v. Pt denies problems with ambulating, voiding or po intake.  She denies nausea or vomiting.  Pain is well controlled.  She has had flatus. She has not had bowel movement.  Lochia Small.    Objective: Blood pressure 93/56, pulse 91, temperature 98 F (36.7 C), temperature source Oral, resp. rate 16, last menstrual period 07/27/2014, SpO2 100 %, unknown if currently breastfeeding.  Physical Exam:  General: alert, cooperative and no distress Lochia:normal flow Chest: CTAB Heart: RRR no m/r/g Abdomen: +BS, soft, nontender, mild distention. Incision c/d/i Uterine Fundus: firm, DVT Evaluation: No evidence of DVT seen on physical exam. Extremities: 1+ pedal edema   Recent Labs  05/08/15 0340 05/08/15 1749  HGB 8.7* 9.1*  HCT 26.0* 27.4*    Assessment/Plan:  ASSESSMENT: Virginia Salas is a 39 y.o. P9K3276 [redacted]w[redacted]d s/p pltcs. Mild abdominal distention, fundus firm, passing flatus and w/o n/v; will continue to monitor. Incision healing well  Plan for discharge tomorrow   LOS: 2 days   Desma Maxim 05/09/2015, 9:55 AM

## 2015-05-10 DIAGNOSIS — Z98891 History of uterine scar from previous surgery: Secondary | ICD-10-CM

## 2015-05-10 MED ORDER — IBUPROFEN 600 MG PO TABS: 600.0000 mg | ORAL_TABLET | Freq: Four times a day (QID) | ORAL | Status: AC | PRN

## 2015-05-10 MED ORDER — DOCUSATE SODIUM 100 MG PO CAPS: 100.0000 mg | ORAL_CAPSULE | Freq: Two times a day (BID) | ORAL | Status: AC

## 2015-05-10 MED ORDER — OXYCODONE-ACETAMINOPHEN 5-325 MG PO TABS: 1.0000 | ORAL_TABLET | Freq: Four times a day (QID) | ORAL | Status: DC | PRN

## 2015-05-10 NOTE — Discharge Summary (Signed)
Physician Discharge Summary  Patient ID: Virginia Salas MRN: [id number] DOB/AGE: 01-29-1976 39 y.o.  Admit date: 05/07/2015 Discharge date: 05/10/2015  Admission Diagnoses: Spontaneous onset of labor   Discharge Diagnoses:  Principal Problem:   Status post primary low transverse cesarean section   Discharged Condition: good  Hospital Course:   DIAGNOSES: Intrauterine pregnancy at [redacted]w[redacted]d weeks gestation, C-section for non-reassuring fetal heart tracing/fetal intolerance of labor  POSTOPERATIVE DIAGNOSES: The same  PROCEDURE: Procedure(s): CESAREAN SECTION (N/A)  SURGEON: Surgeon(s) and Role:  * Osborne Oman, MD - Primary  * Jerrell Belfast. Ernestina Patches, MD  ANESTHESIA: epidural EBL: Total I/O In: 3600 [I.V.:3600] Out: 2375 [Urine:875; Blood:1500]  BLOOD ADMINISTERED:none DRAINS: none  LOCAL MEDICATIONS USED: MARCAINE 30cc SPECIMEN: Placenta DISPOSITION OF SPECIMEN: PATHOLOGY   INDICATIONS: Virginia Salas is a 39 y.o. G3P0020 at [redacted]w[redacted]d presented to the hospital in active labor which a prolonged latent phase. She progressed to active labor but then had protracted first stage. Fetal heart tracing showed recurrent variable decelerations and necessitated IUPC placement. After a reactive and reassuring strip she was found to be relatively unchanged on her cervical exam. She was started on pitocin due to lack of cervical change but this cause late and variable decelerations, thus a positive stress test indicating fetal intolerance of labor. The risks of surgery were discussed with the patient including but were not limited to: bleeding which may require transfusion or reoperation; infection which may require antibiotics; injury to bowel, bladder, ureters or other surrounding organs; injury to the fetus; need for additional procedures including hysterectomy in the event of a life-threatening hemorrhage; placental abnormalities wth subsequent pregnancies, incisional  problems, thromboembolic phenomenon and other postoperative/anesthesia complications. The patient concurred with the proposed plan, giving informed written consent for the procedures. Consent was obtained through the use of an interpreter.  FINDINGS: Viable female infant in cephalic presentation, direct OP. Apgars 9 and 9. Clear amniotic fluid. Intact placenta, three vessel cord. Normal uterus, fallopian tubes and ovaries bilaterally.   PROCEDURE IN DETAIL: The patient preoperatively received intravenous antibiotics and had sequential compression devices applied to her lower extremities. She was then taken to the operating room where the epidural anesthesia was dosed up to surgical level and was found to be adequate. She was then placed in a dorsal supine position with a leftward tilt, and prepped and draped in a sterile manner. A foley catheter was placed into her bladder and attached to constant gravity. After an adequate timeout was performed, a Pfannenstiel skin incision was made with scalpel and carried through to the underlying layer of fascia. The fascia was incised in the midline, and this incision was extended bilaterally using the Mayo scissors. Kocher clamps were applied to the superior aspect of the fascial incision and the underlying rectus muscles were dissected off bluntly and then using bovie. A similar process was carried out on the inferior aspect of the fascial incision. The rectus muscles were separated in the midline bluntly and the peritoneum was entered bluntly. Attention was turned to the lower uterine segment where a low transverse hysterotomy was made with a scalpel and extended bilaterally bluntly. The infant was successfully delivered, the cord was clamped and cut and the infant was handed over to awaiting neonatology team. Uterine massage was then administered, and the placenta delivered intact with a three-vessel cord. The uterus was then cleared of clot and debris. It  was initially boggy with increased bleeding and responded to uterine massage and administration of pitocin. The hysterotomy was closed  with 0 Vicryl in a running locked fashion, and an imbricating layer was also placed with 0 Vicryl. Several figure-of-eight serosal stitches were placed to help with hemostasis. The pelvis was cleared of all clot and debris. Hemostasis was confirmed on all surfaces. The peritoneum and the muscles were reapproximated using 0 Vicryl interrupted stitches. The fascia was then closed using 0 Vicryl in a running fashion. The subcutaneous layer reapproximated with 2-0 plain gut interrupted stitches, and 30 ml of 0.5% Marcaine was injected subcutaneously around the incision. The skin was closed with a 4-0 Vicryl subcuticular stitch using a Lanny Hurst needle. The patient tolerated the procedure well. Sponge, lap, instrument and needle counts were correct x 2. She was taken to the recovery room in stable condition.   Patient was admitted in active labor, however after arrest of dilation, she was taken for C section. She has postpartum course that was uncomplicated including no problems with ambulating, PO intake, urination, pain, or bleeding. The pt feels ready to go home and will be discharged with outpatient follow-up.   Today: No acute events overnight. Pt denies problems with ambulating, voiding or po intake. She denies nausea or vomiting. Pain is moderately controlled. She has had flatus. She has had bowel movement. Lochia Moderate. The patient is hoping to get pregnant again quickly, and will not be using any form of contraception. She was counseled against this by both myself and Dr. Ernestina Patches. Method of Feeding: Breast  Significant Diagnostic Studies: none  Consults: None  Treatments: surgery: pLTCS  Discharge Exam: Blood pressure 98/50, pulse 91, temperature 98 F (36.7 C), temperature source Oral, resp. rate 18, last menstrual period 07/27/2014, SpO2 100 %, unknown  if currently breastfeeding. General appearance: alert, cooperative and no distress Resp: clear to auscultation bilaterally and non-labored Cardio: RRR, no murmurs appreciated GI: non-tender, +BS; uterus firm Extremities: 1+ pitting edema to mid-shin bilaterally; negative Homan's sign, no tenderness to palpation of calves   Disposition: 01-Home or Self Care      Discharge Instructions    Activity as tolerated    Complete by:  As directed      Call MD for:  difficulty breathing, headache or visual disturbances    Complete by:  As directed      Call MD for:  extreme fatigue    Complete by:  As directed      Call MD for:  hives    Complete by:  As directed      Call MD for:  persistant dizziness or light-headedness    Complete by:  As directed      Call MD for:  persistant nausea and vomiting    Complete by:  As directed      Call MD for:  redness, tenderness, or signs of infection (pain, swelling, redness, odor or green/yellow discharge around incision site)    Complete by:  As directed      Call MD for:  severe uncontrolled pain    Complete by:  As directed      Call MD for:  temperature >100.4    Complete by:  As directed      Driving restriction     Complete by:  As directed   Avoid driving for at least 2 weeks.     Lifting restrictions    Complete by:  As directed   Weight restriction of 15 lbs.     Sexual acrtivity    Complete by:  As directed   Pelvic rest for six  weeks. (No tampons or sex.)            Medication List    TAKE these medications        ibuprofen 600 MG tablet  Commonly known as:  ADVIL,MOTRIN  Take 1 tablet (600 mg total) by mouth every 6 (six) hours as needed for mild pain.     oxyCODONE-acetaminophen 5-325 MG per tablet  Commonly known as:  PERCOCET/ROXICET  Take 1-2 tablets by mouth every 6 (six) hours as needed.     prenatal multivitamin Tabs tablet  Take 1 tablet by mouth daily at 12 noon.     zolpidem 5 MG tablet  Commonly known as:   AMBIEN  Take 1 tablet (5 mg total) by mouth at bedtime as needed for sleep.       Follow-up Information    Follow up with [facility]. Schedule an appointment as soon as possible for a visit in 6 weeks.   Why:  For hospital follow-up   Contact information:   [place] [phone]      Signed: Adin Hector, MD PGY-1 [facility]  05/10/2015, 9:08 AM  OB fellow attestation I have seen and examined this patient and agree with above documentation in the resident's note.   Virginia Salas is a 39 y.o. [id number] s/p pLTCS.   Pain is well controlled.  Plan for birth control is no method- we discussed the need to wait 73months between pregnancies to prevent uterine rupture.  Method of Feeding: breast  PE:  BP 98/50 mmHg  Pulse 91  Temp(Src) 98 F (36.7 C) (Oral)  Resp 18  SpO2 100%  LMP 07/27/2014  Breastfeeding? Unknown Fundus firm Incision: clean/dry/intact, no erythema  Recent Labs  05/08/15 0340 05/08/15 1749  HGB 8.7* 9.1*  HCT 26.0* 27.4*   Plan: discharge today - postpartum care discussed - f/u clinic in 6 weeks for postpartum visit   Caren Macadam, MD 9:33 PM

## 2015-05-10 NOTE — Lactation Note (Signed)
This note was copied from the chart of [place]. Lactation Consultation Note  Patient Name: Boy Melrose Kearse [id number] Date: 05/10/2015   Follow up Lactation visit to assess breastfeeding before discharge. Mother has been breast and formula feeding her baby. She denies need for assistance and reports baby is latching well. Baby is tolerating both feeding types, voiding and stooling well. Mom made aware of O/P services, breastfeeding support groups, community resources, and our phone # for post-discharge questions.    Maternal Data    Feeding Length of feed: 15 min  LATCH Score/Interventions                      Lactation Tools Discussed/Used     Consult Status      Stana Bunting M 05/10/2015, 2:37 PM

## 2015-05-10 NOTE — Discharge Instructions (Signed)
Postpartum Care After Cesarean Delivery After you deliver your newborn (postpartum period), the usual stay in the hospital is 24-72 hours. If there were problems with your labor or delivery, or if you have other medical problems, you might be in the hospital longer.  While you are in the hospital, you will receive help and instructions on how to care for yourself and your newborn during the postpartum period.  While you are in the hospital:  It is normal for you to have pain or discomfort from the incision in your abdomen. Be sure to tell your nurses when you are having pain, where the pain is located, and what makes the pain worse.  If you are breastfeeding, you may feel uncomfortable contractions of your uterus for a couple of weeks. This is normal. The contractions help your uterus get back to normal size.  It is normal to have some bleeding after delivery.  For the first 1-3 days after delivery, the flow is red and the amount may be similar to a period.  It is common for the flow to start and stop.  In the first few days, you may pass some small clots. Let your nurses know if you begin to pass large clots or your flow increases.  Do not  flush blood clots down the toilet before having the nurse look at them.  During the next 3-10 days after delivery, your flow should become more watery and pink or brown-tinged in color.  Ten to fourteen days after delivery, your flow should be a small amount of yellowish-white discharge.  The amount of your flow will decrease over the first few weeks after delivery. Your flow may stop in 6-8 weeks. Most women have had their flow stop by 12 weeks after delivery.  You should change your sanitary pads frequently.  Wash your hands thoroughly with soap and water for at least 20 seconds after changing pads, using the toilet, or before holding or feeding your newborn.  Your intravenous (IV) tubing will be removed when you are drinking enough fluids.  The  urine drainage tube (urinary catheter) that was inserted before delivery may be removed within 6-8 hours after delivery or when feeling returns to your legs. You should feel like you need to empty your bladder within the first 6-8 hours after the catheter has been removed.  In case you become weak, lightheaded, or faint, call your nurse before you get out of bed for the first time and before you take a shower for the first time.  Within the first few days after delivery, your breasts may begin to feel tender and full. This is called engorgement. Breast tenderness usually goes away within 48-72 hours after engorgement occurs. You may also notice milk leaking from your breasts. If you are not breastfeeding, do not stimulate your breasts. Breast stimulation can make your breasts produce more milk.  Spending as much time as possible with your newborn is very important. During this time, you and your newborn can feel close and get to know each other. Having your newborn stay in your room (rooming in) will help to strengthen the bond with your newborn. It will give you time to get to know your newborn and become comfortable caring for your newborn.  Your hormones change after delivery. Sometimes the hormone changes can temporarily cause you to feel sad or tearful. These feelings should not last more than a few days. If these feelings last longer than that, you should talk to your  caregiver.  If desired, talk to your caregiver about methods of family planning or contraception.  Talk to your caregiver about immunizations. Your caregiver may want you to have the following immunizations before leaving the hospital:  Tetanus, diphtheria, and pertussis (Tdap) or tetanus and diphtheria (Td) immunization. It is very important that you and your family (including grandparents) or others caring for your newborn are up-to-date with the Tdap or Td immunizations. The Tdap or Td immunization can help protect your newborn  from getting ill.  Rubella immunization.  Varicella (chickenpox) immunization.  Influenza immunization. You should receive this annual immunization if you did not receive the immunization during your pregnancy. Document Released: 05/18/2012 Document Reviewed: 05/18/2012 [facility] Patient Information 2015 [place]. This information is not intended to replace advice given to you by your health care provider. Make sure you discuss any questions you have with your health care provider.   Iron-Rich Diet  An iron-rich diet contains foods that are good sources of iron. Iron is an important mineral that helps your body produce hemoglobin. Hemoglobin is a protein in red blood cells that carries oxygen to the body's tissues. Sometimes, the iron level in your blood can be low. This may be caused by:  A lack of iron in your diet.  Blood loss.  Times of growth, such as during pregnancy or during a child's growth and development. Low levels of iron can cause a decrease in the number of red blood cells. This can result in iron deficiency anemia. Iron deficiency anemia symptoms include:  Tiredness.  Weakness.  Irritability.  Increased chance of infection. Here are some recommendations for daily iron intake:  Males older than 40 years of age need 8 mg of iron per day.  Women ages 51 to 16 need 18 mg of iron per day.  Pregnant women need 27 mg of iron per day, and women who are over 23 years of age and breastfeeding need 9 mg of iron per day.  Women over the age of 47 need 8 mg of iron per day. SOURCES OF IRON There are 2 types of iron that are found in food: heme iron and nonheme iron. Heme iron is absorbed by the body better than nonheme iron. Heme iron is found in meat, poultry, and fish. Nonheme iron is found in grains, beans, and vegetables. Heme Iron Sources Food / Iron (mg)  Chicken liver, 3 oz (85 g)/ 10 mg  Beef liver, 3 oz (85 g)/ 5.5 mg  Oysters, 3 oz (85 g)/ 8  mg  Beef, 3 oz (85 g)/ 2 to 3 mg  Shrimp, 3 oz (85 g)/ 2.8 mg  [place], 3 oz (85 g)/ 2 mg  Chicken, 3 oz (85 g) / 1 mg  Fish (tuna, halibut), 3 oz (85 g)/ 1 mg  Pork, 3 oz (85 g)/ 0.9 mg Nonheme Iron Sources Food / Iron (mg)  Ready-to-eat breakfast cereal, iron-fortified / 3.9 to 7 mg  Tofu,  cup / 3.4 mg  Kidney beans,  cup / 2.6 mg  Baked potato with skin / 2.7 mg  Asparagus,  cup / 2.2 mg  Avocado / 2 mg  Dried peaches,  cup / 1.6 mg  Raisins,  cup / 1.5 mg  Soy milk, 1 cup / 1.5 mg  Whole-wheat bread, 1 slice / 1.2 mg  Spinach, 1 cup / 0.8 mg  Broccoli,  cup / 0.6 mg IRON ABSORPTION Certain foods can decrease the body's absorption of iron. Try to avoid these  foods and beverages while eating meals with iron-containing foods:  Coffee.  Tea.  Fiber.  Soy. Foods containing vitamin C can help increase the amount of iron your body absorbs from iron sources, especially from nonheme sources. Eat foods with vitamin C along with iron-containing foods to increase your iron absorption. Foods that are high in vitamin C include many fruits and vegetables. Some good sources are:  Fresh orange juice.  Oranges.  Strawberries.  [place]oes.  Grapefruit.  Red bell peppers.  Green bell peppers.  Broccoli.  Potatoes with skin.  Tomato juice. Document Released: 04/07/2005 Document Revised: 11/16/2011 Document Reviewed: 02/12/2011 [facility] Patient Information 2015 [place], [place]. This information is not intended to replace advice given to you by your health care provider. Make sure you discuss any questions you have with your health care provider.

## 2015-05-11 ENCOUNTER — Encounter (HOSPITAL_COMMUNITY): Payer: Self-pay | Admitting: Obstetrics & Gynecology

## 2015-05-11 NOTE — Addendum Note (Signed)
Addendum  created 05/11/15 2048 by Josephine Igo, MD   Modules edited: Anesthesia Events, Narrator   Narrator:  Narrator: Event Log Edited

## 2015-06-21 ENCOUNTER — Encounter: Payer: Self-pay | Admitting: Medical

## 2015-06-21 ENCOUNTER — Ambulatory Visit (INDEPENDENT_AMBULATORY_CARE_PROVIDER_SITE_OTHER): Payer: BLUE CROSS/BLUE SHIELD | Admitting: Medical

## 2015-06-21 DIAGNOSIS — D259 Leiomyoma of uterus, unspecified: Secondary | ICD-10-CM | POA: Insufficient documentation

## 2015-06-21 MED ORDER — PRENATAL MULTIVITAMIN CH: 1.0000 | ORAL_TABLET | Freq: Every day | ORAL | Status: DC

## 2015-06-21 NOTE — Progress Notes (Signed)
Patient ID: Virginia Salas, female   DOB: 18-Oct-1975, 39 y.o.   MRN: [id number] Subjective:     Virginia Salas is a 39 y.o. female who presents for a postpartum visit. She is 6 weeks postpartum following a low cervical transverse Cesarean section. I have fully reviewed the prenatal and intrapartum course. The delivery was at 40.4 gestational weeks. Outcome: primary cesarean section, low transverse incision. Anesthesia: epidural. Postpartum course has been normal. Baby's course has been normal. Baby is feeding by bottle - Similac Advance. Bleeding no bleeding. Bowel function is normal. Bladder function is normal. Patient is not sexually active. Contraception method is none. Postpartum depression screening: negative.  The following portions of the patient's history were reviewed and updated as appropriate: allergies, current medications, past family history, past medical history, past social history, past surgical history and problem list.  Review of Systems Pertinent items are noted in HPI.   Objective:    BP 119/71 mmHg  Pulse 87  Temp(Src) 98.3 F (36.8 C)  Ht 5' 0.75" (1.543 m)  Wt 141 lb 8 oz (64.184 kg)  BMI 26.96 kg/m2  General:  alert and cooperative   Breasts:  not performed  Lungs: clear to auscultation bilaterally  Heart:  regular rate and rhythm, S1, S2 normal, no murmur, click, rub or gallop  Abdomen: soft, mild tenderness to palpation of the LLQ, normal bowel sounds   Vulva:  not evaluated  Vagina: not evaluated  Cervix:  not evaluated  Corpus: not examined  Adnexa:  not evaluated  Rectal Exam: Not performed.        Assessment:     Normal postpartum exam. Pap smear not done at today's visit.   Plan:    1. Contraception: none or condoms. Patient wishes to have another pregnancy soon 2. Patient states that she is not mentally ready to return to work. I have cleared her physically and given her contact information for mental health in [place].  3.  Follow up in: 1 year  for annual exam or as needed.

## 2015-06-21 NOTE — Patient Instructions (Signed)
Cesarean Delivery, Care After  Refer to this sheet in the next few weeks. These instructions provide you with information on caring for yourself after your procedure. Your health care provider may also give you specific instructions. Your treatment has been planned according to current medical practices, but problems sometimes occur. Call your health care provider if you have any problems or questions after you go home.  HOME CARE INSTRUCTIONS   Only take over-the-counter or prescription medications as directed by your health care provider.   Do not drink alcohol, especially if you are breastfeeding or taking medication to relieve pain.   Do not chew or smoke tobacco.   Continue to use good perineal care. Good perineal care includes:    Wiping your perineum from front to back.    Keeping your perineum clean.   Check your surgical cut (incision) daily for increased redness, drainage, swelling, or separation of skin.   Clean your incision gently with soap and water every day, and then pat it dry. If your health care provider says it is okay, leave the incision uncovered. Use a bandage (dressing) if the incision is draining fluid or appears irritated. If the adhesive strips across the incision do not fall off within 7 days, carefully peel them off.   Hug a pillow when coughing or sneezing until your incision is healed. This helps to relieve pain.   Do not use tampons or douche until your health care provider says it is okay.   Shower, wash your hair, and take tub baths as directed by your health care provider.   Wear a well-fitting bra that provides breast support.   Limit wearing support panties or control-top hose.   Drink enough fluids to keep your urine clear or pale yellow.   Eat high-fiber foods such as whole grain cereals and breads, brown rice, beans, and fresh fruits and vegetables every day. These foods may help prevent or relieve constipation.   Resume activities such as climbing stairs,  driving, lifting, exercising, or traveling as directed by your health care provider.   Talk to your health care provider about resuming sexual activities. This is dependent upon your risk of infection, your rate of healing, and your comfort and desire to resume sexual activity.   Try to have someone help you with your household activities and your newborn for at least a few days after you leave the hospital.   Rest as much as possible. Try to rest or take a nap when your newborn is sleeping.   Increase your activities gradually.   Keep all of your scheduled postpartum appointments. It is very important to keep your scheduled follow-up appointments. At these appointments, your health care provider will be checking to make sure that you are healing physically and emotionally.  SEEK MEDICAL CARE IF:    You are passing large clots from your vagina. Save any clots to show your health care provider.   You have a foul smelling discharge from your vagina.   You have trouble urinating.   You are urinating frequently.   You have pain when you urinate.   You have a change in your bowel movements.   You have increasing redness, pain, or swelling near your incision.   You have pus draining from your incision.   Your incision is separating.   You have painful, hard, or reddened breasts.   You have a severe headache.   You have blurred vision or see spots.   You feel sad   or depressed.   You have thoughts of hurting yourself or your newborn.   You have questions about your care, the care of your newborn, or medications.   You are dizzy or light-headed.   You have a rash.   You have pain, redness, or swelling at the site of the removed intravenous access (IV) tube.   You have nausea or vomiting.   You stopped breastfeeding and have not had a menstrual period within 12 weeks of stopping.   You are not breastfeeding and have not had a menstrual period within 12 weeks of delivery.   You have a fever.  SEEK  IMMEDIATE MEDICAL CARE IF:   You have persistent pain.   You have chest pain.   You have shortness of breath.   You faint.   You have leg pain.   You have stomach pain.   Your vaginal bleeding saturates 2 or more sanitary pads in 1 hour.  MAKE SURE YOU:    Understand these instructions.   Will watch your condition.   Will get help right away if you are not doing well or get worse.     This information is not intended to replace advice given to you by your health care provider. Make sure you discuss any questions you have with your health care provider.     Document Released: 05/16/2002 Document Revised: 09/14/2014 Document Reviewed: 04/20/2012  [employer] Interactive Patient Education 2016 [employer]

## 2015-06-21 NOTE — Progress Notes (Signed)
Used [employer].

## 2015-09-17 ENCOUNTER — Other Ambulatory Visit: Payer: Self-pay | Admitting: Medical

## 2017-11-08 ENCOUNTER — Encounter: Payer: Self-pay | Admitting: *Deleted
